# Patient Record
Sex: Male | Born: 1945 | Race: Black or African American | Hispanic: No | Marital: Single | Smoking: Never smoker
Health system: Southern US, Community
[De-identification: ages and names within clinical notes are randomized; demographics above are authoritative.]

## PROBLEM LIST (undated history)

## (undated) DIAGNOSIS — N419 Inflammatory disease of prostate, unspecified: Secondary | ICD-10-CM

## (undated) DIAGNOSIS — R3912 Poor urinary stream: Secondary | ICD-10-CM

## (undated) DIAGNOSIS — R972 Elevated prostate specific antigen [PSA]: Secondary | ICD-10-CM

## (undated) DIAGNOSIS — I10 Essential (primary) hypertension: Secondary | ICD-10-CM

## (undated) DIAGNOSIS — N400 Enlarged prostate without lower urinary tract symptoms: Secondary | ICD-10-CM

## (undated) DIAGNOSIS — K4090 Unilateral inguinal hernia, without obstruction or gangrene, not specified as recurrent: Secondary | ICD-10-CM

## (undated) DIAGNOSIS — N433 Hydrocele, unspecified: Secondary | ICD-10-CM

## (undated) HISTORY — DX: Hydrocele, unspecified: N43.3

## (undated) HISTORY — DX: Unilateral inguinal hernia, without obstruction or gangrene, not specified as recurrent: K40.90

## (undated) HISTORY — DX: Benign prostatic hyperplasia without lower urinary tract symptoms: N40.0

## (undated) HISTORY — DX: Essential (primary) hypertension: I10

## (undated) HISTORY — DX: Elevated prostate specific antigen (PSA): R97.20

## (undated) HISTORY — DX: Poor urinary stream: R39.12

## (undated) HISTORY — DX: Inflammatory disease of prostate, unspecified: N41.9

## (undated) HISTORY — PX: HERNIA REPAIR: SHX51

---

## 2005-05-02 ENCOUNTER — Other Ambulatory Visit: Payer: Self-pay

## 2005-05-07 ENCOUNTER — Ambulatory Visit: Payer: Self-pay | Admitting: Surgery

## 2011-02-20 HISTORY — PX: OTHER SURGICAL HISTORY: SHX169

## 2011-08-07 DIAGNOSIS — N433 Hydrocele, unspecified: Secondary | ICD-10-CM | POA: Diagnosis not present

## 2011-08-07 DIAGNOSIS — N432 Other hydrocele: Secondary | ICD-10-CM | POA: Diagnosis not present

## 2011-08-07 DIAGNOSIS — R972 Elevated prostate specific antigen [PSA]: Secondary | ICD-10-CM | POA: Diagnosis not present

## 2011-09-11 DIAGNOSIS — R972 Elevated prostate specific antigen [PSA]: Secondary | ICD-10-CM | POA: Diagnosis not present

## 2011-09-11 DIAGNOSIS — N433 Hydrocele, unspecified: Secondary | ICD-10-CM | POA: Diagnosis not present

## 2011-09-11 DIAGNOSIS — N432 Other hydrocele: Secondary | ICD-10-CM | POA: Diagnosis not present

## 2011-11-27 DIAGNOSIS — N400 Enlarged prostate without lower urinary tract symptoms: Secondary | ICD-10-CM | POA: Diagnosis not present

## 2011-11-27 DIAGNOSIS — N433 Hydrocele, unspecified: Secondary | ICD-10-CM | POA: Diagnosis not present

## 2011-11-27 DIAGNOSIS — N411 Chronic prostatitis: Secondary | ICD-10-CM | POA: Diagnosis not present

## 2011-11-27 DIAGNOSIS — R972 Elevated prostate specific antigen [PSA]: Secondary | ICD-10-CM | POA: Diagnosis not present

## 2011-11-28 ENCOUNTER — Ambulatory Visit: Payer: Self-pay | Admitting: Urology

## 2011-11-28 DIAGNOSIS — Z01810 Encounter for preprocedural cardiovascular examination: Secondary | ICD-10-CM | POA: Diagnosis not present

## 2011-11-28 DIAGNOSIS — N432 Other hydrocele: Secondary | ICD-10-CM | POA: Diagnosis not present

## 2011-11-28 DIAGNOSIS — I4510 Unspecified right bundle-branch block: Secondary | ICD-10-CM | POA: Diagnosis not present

## 2011-11-28 DIAGNOSIS — Z01812 Encounter for preprocedural laboratory examination: Secondary | ICD-10-CM | POA: Diagnosis not present

## 2011-12-03 ENCOUNTER — Ambulatory Visit: Payer: Self-pay | Admitting: Urology

## 2011-12-03 DIAGNOSIS — I4510 Unspecified right bundle-branch block: Secondary | ICD-10-CM | POA: Diagnosis not present

## 2011-12-03 DIAGNOSIS — R972 Elevated prostate specific antigen [PSA]: Secondary | ICD-10-CM | POA: Diagnosis not present

## 2011-12-03 DIAGNOSIS — N432 Other hydrocele: Secondary | ICD-10-CM | POA: Diagnosis not present

## 2011-12-03 DIAGNOSIS — N400 Enlarged prostate without lower urinary tract symptoms: Secondary | ICD-10-CM | POA: Diagnosis not present

## 2011-12-03 DIAGNOSIS — N433 Hydrocele, unspecified: Secondary | ICD-10-CM | POA: Diagnosis not present

## 2011-12-03 DIAGNOSIS — N411 Chronic prostatitis: Secondary | ICD-10-CM | POA: Diagnosis not present

## 2011-12-03 LAB — BASIC METABOLIC PANEL
Anion Gap: 6 — ABNORMAL LOW (ref 7–16)
BUN: 20 mg/dL — ABNORMAL HIGH (ref 7–18)
Chloride: 110 mmol/L — ABNORMAL HIGH (ref 98–107)
Creatinine: 1.23 mg/dL (ref 0.60–1.30)
EGFR (African American): >60
Potassium: 3.8 mmol/L (ref 3.5–5.1)

## 2011-12-06 LAB — PATHOLOGY REPORT

## 2012-02-06 DIAGNOSIS — N433 Hydrocele, unspecified: Secondary | ICD-10-CM | POA: Diagnosis not present

## 2012-02-06 DIAGNOSIS — Z125 Encounter for screening for malignant neoplasm of prostate: Secondary | ICD-10-CM | POA: Diagnosis not present

## 2012-02-06 DIAGNOSIS — N400 Enlarged prostate without lower urinary tract symptoms: Secondary | ICD-10-CM | POA: Diagnosis not present

## 2012-02-06 DIAGNOSIS — R972 Elevated prostate specific antigen [PSA]: Secondary | ICD-10-CM | POA: Diagnosis not present

## 2012-08-06 DIAGNOSIS — N433 Hydrocele, unspecified: Secondary | ICD-10-CM | POA: Diagnosis not present

## 2012-08-06 DIAGNOSIS — R972 Elevated prostate specific antigen [PSA]: Secondary | ICD-10-CM | POA: Diagnosis not present

## 2012-08-06 DIAGNOSIS — N400 Enlarged prostate without lower urinary tract symptoms: Secondary | ICD-10-CM | POA: Diagnosis not present

## 2013-02-05 DIAGNOSIS — N400 Enlarged prostate without lower urinary tract symptoms: Secondary | ICD-10-CM | POA: Diagnosis not present

## 2013-02-05 DIAGNOSIS — R972 Elevated prostate specific antigen [PSA]: Secondary | ICD-10-CM | POA: Diagnosis not present

## 2013-05-28 DIAGNOSIS — R609 Edema, unspecified: Secondary | ICD-10-CM | POA: Diagnosis not present

## 2013-05-28 DIAGNOSIS — N400 Enlarged prostate without lower urinary tract symptoms: Secondary | ICD-10-CM | POA: Diagnosis not present

## 2013-08-06 DIAGNOSIS — N400 Enlarged prostate without lower urinary tract symptoms: Secondary | ICD-10-CM | POA: Diagnosis not present

## 2014-02-05 DIAGNOSIS — N433 Hydrocele, unspecified: Secondary | ICD-10-CM | POA: Diagnosis not present

## 2014-02-05 DIAGNOSIS — R3912 Poor urinary stream: Secondary | ICD-10-CM | POA: Diagnosis not present

## 2014-02-05 DIAGNOSIS — R972 Elevated prostate specific antigen [PSA]: Secondary | ICD-10-CM | POA: Diagnosis not present

## 2014-02-05 DIAGNOSIS — R030 Elevated blood-pressure reading, without diagnosis of hypertension: Secondary | ICD-10-CM | POA: Diagnosis not present

## 2014-02-05 DIAGNOSIS — N400 Enlarged prostate without lower urinary tract symptoms: Secondary | ICD-10-CM | POA: Diagnosis not present

## 2014-06-08 NOTE — Op Note (Signed)
PATIENT NAME:  Brendan Carter, Brendan Carter MR#:  [id number] DATE OF BIRTH:  Mar 28, 1945  DATE OF PROCEDURE:  12/03/2011  PREOPERATIVE DIAGNOSIS: Right hydrocele.  POSTOPERATIVE DIAGNOSIS:  Right hydrocele.  PROCEDURE:  Hydrocelectomy.  SURGEON: Alcus Dad, MD   ANESTHESIA:  General.  INDICATIONS: This patient has had a hydrocele on the right side which occurred following a herniorrhaphy. Antibiotics were not successful at alleviating the persistence of the hydrocele. Aspirations were only temporary and the patient is scheduled at this time for hydrocelectomy.   DESCRIPTION OF PROCEDURE: In the supine position under general anesthesia, the lower abdomen and genital area were prepped and draped for surgery. An incision was made in the scrotal raphe and carried for the extent of the tangent of the hydrocele sac. This amounted to approximately a 9-cm incision. Sharp dissection was used through the layers to the tunica vaginalis. Surrounding attachments were dissected free with alternate blunt and sharp dissection. Bleeding points were cauterized as they appeared. The fluid was drained from the hydrocele sac amounting to 450 mL. The tunica vaginalis was removed, leaving a centimeter margin along the perimeter of the testis and vascular structures. A portion of the remnant of the hydrocele sac was imbricated to obviate a pocket for fluid collection, and after a more thorough search for bleeding points the testis was placed into the scrotal compartment and fixing sutures placed between the scrotal septum and the testicular structures. Marcaine was infiltrated into the walls of the scrotum and the scrotal layers closed with continuous 3-0 chromic catgut. Interrupted 3-0 chromic catgut was used to close the scrotal skin. A bulky dressing was applied. The patient tolerated the procedure well and returned to the recovery area in satisfactory condition.   ____________________________ Alcus Dad, MD jsh:bjt D:   12/03/2011 16:01:05 ET          T: 12/03/2011 16:54:49 ET          JOB#:  [id number]  Alcus Dad MD ELECTRONICALLY SIGNED 12/06/2011 10:44

## 2014-08-05 ENCOUNTER — Encounter: Payer: Self-pay | Admitting: Urology

## 2014-08-05 ENCOUNTER — Ambulatory Visit (INDEPENDENT_AMBULATORY_CARE_PROVIDER_SITE_OTHER): Payer: Medicare Other | Admitting: Urology

## 2014-08-05 VITALS — BP 112/71 | HR 71 | Ht 68.0 in | Wt 142.9 lb

## 2014-08-05 DIAGNOSIS — N432 Other hydrocele: Secondary | ICD-10-CM

## 2014-08-05 DIAGNOSIS — R972 Elevated prostate specific antigen [PSA]: Secondary | ICD-10-CM

## 2014-08-05 DIAGNOSIS — N401 Enlarged prostate with lower urinary tract symptoms: Secondary | ICD-10-CM | POA: Diagnosis not present

## 2014-08-05 DIAGNOSIS — N138 Other obstructive and reflux uropathy: Secondary | ICD-10-CM

## 2014-08-05 MED ORDER — FINASTERIDE 5 MG PO TABS: 5.0000 mg | ORAL_TABLET | Freq: Every day | ORAL | Status: DC

## 2014-08-05 NOTE — Progress Notes (Signed)
08/05/2014 12:22 PM   Brendan Carter [birth year] [id number]  Referring provider: No referring provider defined for this encounter.  Chief Complaint  Patient presents with  . Elevated PSA    6 month follow up  . Benign Prostatic Hypertrophy    HPI: Brendan Carter is a 69 y/o AA male with BPH and LUTS, hydroceles and  elevated PSA who presents for his 6 months follow up.  His IPSS score today is 3/1 (mild).  He is on finasteride 5 mg daily for his prostate.  He complains of frequency, urgency and weak stream.   He has not had any UTI's, fevers, chills, nausea, vomiting, suprapubic pain or blood in the urine.       IPSS      08/05/14 1000       International Prostate Symptom Score   How often have you had the sensation of not emptying your bladder? Not at All     How often have you had to urinate less than every two hours? Less than 1 in 5 times     How often have you found you stopped and started again several times when you urinated? Not at All     How often have you found it difficult to postpone urination? Less than 1 in 5 times     How often have you had a weak urinary stream? Less than 1 in 5 times     How often have you had to strain to start urination? Not at All     How many times did you typically get up at night to urinate? None     Total IPSS Score 3     Quality of Life due to urinary symptoms   If you were to spend the rest of your life with your urinary condition just the way it is now how would you feel about that? Pleased       His PSA History is :  11.3 ng/mL on 11/27/2011.   His has massive BPH, but no family history of PCa.                                     07.8 ng/mL on 08/06/2012                                   10.5 ng/mL on 02/05/2013                                   08.2 ng/mL on 08/06/2013                                   08.7 ng/mL on 02/05/2014                   He has bilateral hydroceles, but he is not having difficulty with them.  He is managing  them with conservative measures.  He is not interested in drainage or hydrocelectomy at this time.  He did undergo a right hydrocelectomy in 2013 with Dr. Madelin Headings.     PMH: Past Medical History  Diagnosis Date  . Elevated PSA   . Hydrocele   . Prostatitis   . Inguinal hernia  left  . BPH (benign prostatic hyperplasia)   . Weak urinary stream   . Hypertension     Surgical History: Past Surgical History  Procedure Laterality Date  . Hydrocelectomy Right 2013  . Hernia repair Right     Home Medications:    Medication List       This list is accurate as of: 08/05/14 12:22 PM.  Always use your most recent med list.               finasteride 5 MG tablet  Commonly known as:  PROSCAR  Take 5 mg by mouth daily.        Allergies: No Known Allergies  Family History: Family History  Problem Relation Age of Onset  . Kidney cancer Neg Hx   . Kidney disease Neg Hx   . Prostate cancer Neg Hx     Social History:  reports that he has never smoked. He does not have any smokeless tobacco history on file. He reports that he does not drink alcohol or use illicit drugs.  ROS: Urological Symptom Review  Patient is experiencing the following symptoms: No GU complaints at this time.   Review of Systems  Gastrointestinal (upper)  : Negative for upper GI symptoms  Gastrointestinal (lower) : Negative for lower GI symptoms  Constitutional : Negative for symptoms  Skin: Negative for skin symptoms  Eyes: Negative for eye symptoms  Ear/Nose/Throat : Negative for Ear/Nose/Throat symptoms  Hematologic/Lymphatic: Negative for Hematologic/Lymphatic symptoms  Cardiovascular : Negative for cardiovascular symptoms  Respiratory : Negative for respiratory symptoms  Endocrine: Negative for endocrine symptoms  Musculoskeletal: Negative for musculoskeletal symptoms  Neurological: Negative for neurological symptoms  Psychologic: Negative for psychiatric  symptoms   Physical Exam: BP 112/71 mmHg  Pulse 71  Ht 5\' 8"  (1.727 m)  Wt 142 lb 14.4 oz (64.819 kg)  BMI 21.73 kg/m2  GU: Patient with uncircumcised phallus. Foreskin easily retracted.  Urethral meatus is patent.  No penile discharge. No penile lesions or rashes. Scrotum without lesions, cysts, rashes and/or edema.  Patient with bilateral hydroceles, left larger than the right.  Testicles are located scrotally bilaterally. No masses are appreciated in the testicles. Left and right epididymis are normal. Rectal: Patient with  normal sphincter tone. Perineum without scarring or rashes. No rectal masses are appreciated. Prostate is approximately 100 grams, no nodules are appreciated. Seminal vesicles are normal.   Laboratory Data: Lab Results  Component Value Date   HGB 14.2 11/28/2011    Lab Results  Component Value Date   CREATININE 1.23 12/03/2011    No results found for: PSA  No results found for: TESTOSTERONE  No results found for: HGBA1C  Urinalysis No results found for: COLORURINE, APPEARANCEUR, LABSPEC, PHURINE, GLUCOSEU, HGBUR, BILIRUBINUR, KETONESUR, PROTEINUR, UROBILINOGEN, NITRITE, LEUKOCYTESUR  Pertinent Imaging:   Assessment & Plan:    1. Elevated PSA-  Patient has had a h/o elevated PSA for the last 10 yrs.  His PSA's have been in the 8 to 10 range.  There is no record of a bx being performed and patient does not recall having a bx in the past.  He has massive BPH.  We are following him closely with DRE's, PSA's and IPSS score's.    PSA History:             11.3 ng/mL on 11/27/2011.  07.8 ng/mL on 08/06/2012                                   10.5 ng/mL on 02/05/2013                                   08.2 ng/mL on 08/06/2013                                   08.7 ng/mL on 02/05/2014                    - PSA drawn today  2. BPH with LUTS-  Patient has massive BPH, but his IPSS score 3/1 (mild).  We will continue to  monitor him every 6 months with DRE's, PSA's and IPSS score's.    3. 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 He is not finding them bothersome at this time, so he is not interested in drainage or hydrocelectomy at this time.  We will continue to monitor.     No Follow-up on file.  Zara Council, [facility] [address], [place], [place] [address] [phone]

## 2014-08-06 ENCOUNTER — Telehealth: Payer: Self-pay

## 2014-08-06 LAB — PSA: PROSTATE SPECIFIC AG, SERUM: 8.8 ng/mL — AB (ref 0.0–4.0)

## 2014-08-06 NOTE — Telephone Encounter (Signed)
Line Busy.

## 2014-08-06 NOTE — Telephone Encounter (Signed)
-----   Message from Nori Riis, PA-C sent at 08/06/2014  8:20 AM EDT ----- PSA is stable.  We will see him in 6 months.

## 2014-08-09 NOTE — Telephone Encounter (Signed)
Line busy

## 2014-08-10 NOTE — Telephone Encounter (Signed)
Will send letter with information from [facility]. Cw,lpn

## 2015-01-12 ENCOUNTER — Emergency Department: Payer: Medicare Other

## 2015-01-12 ENCOUNTER — Inpatient Hospital Stay
Admission: EM | Admit: 2015-01-12 | Discharge: 2015-01-15 | DRG: 872 | Disposition: A | Discharge status: Home / Self Care | Payer: Medicare Other | Attending: Internal Medicine | Admitting: Internal Medicine

## 2015-01-12 ENCOUNTER — Encounter: Payer: Self-pay | Admitting: Emergency Medicine

## 2015-01-12 DIAGNOSIS — N400 Enlarged prostate without lower urinary tract symptoms: Secondary | ICD-10-CM | POA: Diagnosis present

## 2015-01-12 DIAGNOSIS — R509 Fever, unspecified: Secondary | ICD-10-CM

## 2015-01-12 DIAGNOSIS — J9811 Atelectasis: Secondary | ICD-10-CM | POA: Diagnosis present

## 2015-01-12 DIAGNOSIS — N451 Epididymitis: Secondary | ICD-10-CM | POA: Diagnosis present

## 2015-01-12 DIAGNOSIS — N12 Tubulo-interstitial nephritis, not specified as acute or chronic: Secondary | ICD-10-CM | POA: Diagnosis present

## 2015-01-12 DIAGNOSIS — K4090 Unilateral inguinal hernia, without obstruction or gangrene, not specified as recurrent: Secondary | ICD-10-CM | POA: Diagnosis present

## 2015-01-12 DIAGNOSIS — A419 Sepsis, unspecified organism: Secondary | ICD-10-CM | POA: Diagnosis not present

## 2015-01-12 DIAGNOSIS — N401 Enlarged prostate with lower urinary tract symptoms: Secondary | ICD-10-CM | POA: Diagnosis present

## 2015-01-12 DIAGNOSIS — N3000 Acute cystitis without hematuria: Secondary | ICD-10-CM | POA: Diagnosis present

## 2015-01-12 DIAGNOSIS — A4151 Sepsis due to Escherichia coli [E. coli]: Secondary | ICD-10-CM | POA: Diagnosis present

## 2015-01-12 DIAGNOSIS — I861 Scrotal varices: Secondary | ICD-10-CM | POA: Diagnosis present

## 2015-01-12 DIAGNOSIS — N50819 Testicular pain, unspecified: Secondary | ICD-10-CM | POA: Diagnosis present

## 2015-01-12 DIAGNOSIS — N5082 Scrotal pain: Secondary | ICD-10-CM

## 2015-01-12 DIAGNOSIS — N390 Urinary tract infection, site not specified: Secondary | ICD-10-CM | POA: Diagnosis not present

## 2015-01-12 DIAGNOSIS — I10 Essential (primary) hypertension: Secondary | ICD-10-CM | POA: Diagnosis present

## 2015-01-12 DIAGNOSIS — R972 Elevated prostate specific antigen [PSA]: Secondary | ICD-10-CM

## 2015-01-12 DIAGNOSIS — E876 Hypokalemia: Secondary | ICD-10-CM | POA: Diagnosis present

## 2015-01-12 DIAGNOSIS — Z8042 Family history of malignant neoplasm of prostate: Secondary | ICD-10-CM

## 2015-01-12 DIAGNOSIS — R000 Tachycardia, unspecified: Secondary | ICD-10-CM | POA: Diagnosis present

## 2015-01-12 DIAGNOSIS — N503 Cyst of epididymis: Secondary | ICD-10-CM | POA: Diagnosis not present

## 2015-01-12 DIAGNOSIS — Z8051 Family history of malignant neoplasm of kidney: Secondary | ICD-10-CM

## 2015-01-12 LAB — CBC WITH DIFFERENTIAL/PLATELET
Basophils Absolute: 0 10*3/uL (ref 0–0.1)
Basophils Relative: 0 %
EOS PCT: 0 %
Eosinophils Absolute: 0 10*3/uL (ref 0–0.7)
HEMATOCRIT: 39.4 % — AB (ref 40.0–52.0)
Hemoglobin: 13.1 g/dL (ref 13.0–18.0)
LYMPHS ABS: 0.4 10*3/uL — AB (ref 1.0–3.6)
LYMPHS PCT: 2 %
MCH: 33.2 pg (ref 26.0–34.0)
MCHC: 33.3 g/dL (ref 32.0–36.0)
MCV: 99.8 fL (ref 80.0–100.0)
MONO ABS: 0.6 10*3/uL (ref 0.2–1.0)
Monocytes Relative: 3 %
Neutro Abs: 18.2 10*3/uL — ABNORMAL HIGH (ref 1.4–6.5)
Neutrophils Relative %: 95 %
PLATELETS: 155 10*3/uL (ref 150–440)
RBC: 3.95 MIL/uL — AB (ref 4.40–5.90)
RDW: 13 % (ref 11.5–14.5)
WBC: 19.3 10*3/uL — AB (ref 3.8–10.6)

## 2015-01-12 LAB — COMPREHENSIVE METABOLIC PANEL
ALBUMIN: 3.7 g/dL (ref 3.5–5.0)
ALT: 21 U/L (ref 17–63)
AST: 25 U/L (ref 15–41)
Alkaline Phosphatase: 78 U/L (ref 38–126)
Anion gap: 7 (ref 5–15)
BUN: 20 mg/dL (ref 6–20)
CHLORIDE: 102 mmol/L (ref 101–111)
CO2: 27 mmol/L (ref 22–32)
Calcium: 8.7 mg/dL — ABNORMAL LOW (ref 8.9–10.3)
Creatinine, Ser: 1.15 mg/dL (ref 0.61–1.24)
GFR calc Af Amer: >60 mL/min (ref >60)
GFR calc non Af Amer: >60 mL/min (ref >60)
GLUCOSE: 175 mg/dL — AB (ref 65–99)
POTASSIUM: 3.7 mmol/L (ref 3.5–5.1)
Sodium: 136 mmol/L (ref 135–145)
Total Bilirubin: 0.6 mg/dL (ref 0.3–1.2)
Total Protein: 7.4 g/dL (ref 6.5–8.1)

## 2015-01-12 LAB — URINALYSIS COMPLETE WITH MICROSCOPIC (ARMC ONLY)
Bilirubin Urine: NEGATIVE
Nitrite: POSITIVE — AB
PH: 6 (ref 5.0–8.0)
PROTEIN: 100 mg/dL — AB
Specific Gravity, Urine: 1.023 (ref 1.005–1.030)

## 2015-01-12 LAB — LACTIC ACID, PLASMA: Lactic Acid, Venous: 1.6 mmol/L (ref 0.5–2.0)

## 2015-01-12 MED ORDER — SODIUM CHLORIDE 0.9 % IV BOLUS (SEPSIS)
1000.0000 mL | Freq: Once | INTRAVENOUS | Status: AC
  Administered 2015-01-12: 1000 mL via INTRAVENOUS

## 2015-01-12 MED ORDER — DEXTROSE 5 % IV SOLN
INTRAVENOUS | Status: AC
  Filled 2015-01-12: qty 10

## 2015-01-12 MED ORDER — DOXYCYCLINE HYCLATE 100 MG PO TABS
100.0000 mg | ORAL_TABLET | Freq: Two times a day (BID) | ORAL | Status: DC
  Administered 2015-01-12 – 2015-01-14 (×4): 100 mg via ORAL
  Filled 2015-01-12 (×4): qty 1

## 2015-01-12 MED ORDER — DEXTROSE 5 % IV SOLN
1.0000 g | Freq: Once | INTRAVENOUS | Status: AC
  Administered 2015-01-12: 1 g via INTRAVENOUS

## 2015-01-12 MED ORDER — ACETAMINOPHEN 325 MG PO TABS
650.0000 mg | ORAL_TABLET | Freq: Once | ORAL | Status: AC | PRN
  Administered 2015-01-12: 650 mg via ORAL
  Filled 2015-01-12: qty 2

## 2015-01-12 NOTE — ED Notes (Signed)
Patient presents to the ED with left testicular pain and fever.  Patient states he had an inguinal hernia repair done 2 years ago and has had swelling in his left testicle when he is up during the day since that time but today is the first day the left testicle has had pain as well as swelling.  Patient also reports recent cold symptoms.

## 2015-01-12 NOTE — ED Notes (Addendum)
Pt placed on oxygen at 1lpm via [place]. Pt and spouse updated on care plan. Pt and spouse verbalize understanding. Call bell at right side.

## 2015-01-12 NOTE — ED Notes (Signed)
Ultrasound in progress in room.  

## 2015-01-12 NOTE — ED Notes (Signed)
Pt into room 11 from xray. Pt assisted up to bed, skin hot and dry. md notified of possible sepsis pt and in to see pt.

## 2015-01-12 NOTE — ED Notes (Signed)
md notified urine has resulted.

## 2015-01-12 NOTE — H&P (Signed)
[place] [facility] [place] NAME: Brendan Carter    MR#:  [id number]  DATE OF BIRTH:  30-Oct-1945  DATE OF ADMISSION:  01/12/2015  PRIMARY CARE PHYSICIAN: No PCP Per Patient   REQUESTING/REFERRING PHYSICIAN: Marcelene Butte, M.D.  CHIEF COMPLAINT:   Chief Complaint  Patient presents with  . Groin Swelling  . Groin Pain    HISTORY OF PRESENT ILLNESS:  Brendan Carter  is a 69 y.o. male who presents with left-sided testicular pain and fever. Patient is poor historian, but has a history of BPH on finasteride. He denies any other infectious symptoms. Patient states that the testicular pain has been present only since earlier this morning. He denies any aggravating or alleviating factors. He did not try and take anything for this condition. In the ED he was found to have a nitrite positive UA, and on ultrasound of the scrotum was found to have epididymitis. He was also found to have a leukocytosis with white blood cell count at 19. Hospitalists were called for admission for sepsis due to UTI and epididymitis.  PAST MEDICAL HISTORY:   Past Medical History  Diagnosis Date  . Elevated PSA   . Hydrocele   . Prostatitis   . Inguinal hernia     left  . BPH (benign prostatic hyperplasia)   . Weak urinary stream   . Hypertension     PAST SURGICAL HISTORY:   Past Surgical History  Procedure Laterality Date  . Hydrocelectomy Right 2013  . Hernia repair Right     SOCIAL HISTORY:   Social History  Substance Use Topics  . Smoking status: Never Smoker   . Smokeless tobacco: Not on file  . Alcohol Use: No    FAMILY HISTORY:   Family History  Problem Relation Age of Onset  . Kidney cancer Neg Hx   . Kidney disease Neg Hx   . Prostate cancer Neg Hx     DRUG ALLERGIES:  No Known Allergies  MEDICATIONS AT HOME:   Prior to Admission medications   Medication Sig Start Date End Date Taking? Authorizing Provider  Chlorphen-Phenyleph-ASA  (ALKA-SELTZER PLUS COLD PO) Take 2 tablets by mouth as needed (for cough/congestion).   Yes Historical Provider, MD  finasteride (PROSCAR) 5 MG tablet Take 1 tablet (5 mg total) by mouth daily. 08/05/14  Yes Nori Riis, PA-C    REVIEW OF SYSTEMS:  Review of Systems  Constitutional: Positive for fever. Negative for chills, weight loss and malaise/fatigue.  HENT: Negative for ear pain, hearing loss and tinnitus.   Eyes: Negative for blurred vision, double vision, pain and redness.  Respiratory: Negative for cough, hemoptysis and shortness of breath.   Cardiovascular: Negative for chest pain, palpitations, orthopnea and leg swelling.  Gastrointestinal: Negative for nausea, vomiting, abdominal pain, diarrhea and constipation.  Genitourinary: Negative for dysuria, frequency and hematuria.       Left testicular pain  Musculoskeletal: Negative for back pain, joint pain and neck pain.  Skin:       No acne, rash, or lesions  Neurological: Negative for dizziness, tremors, focal weakness and weakness.  Endo/Heme/Allergies: Negative for polydipsia. Does not bruise/bleed easily.  Psychiatric/Behavioral: Negative for depression. The patient is not nervous/anxious and does not have insomnia.      VITAL SIGNS:   Filed Vitals:   01/12/15 2250 01/12/15 2300 01/12/15 2315 01/12/15 2330  BP:  104/67  105/76  Pulse:  84 85 85  Temp: 99.5 F (37.5 C)  TempSrc: Oral     Resp:  21 19 21   Height:      Weight:      SpO2:  100% 100% 100%   Wt Readings from Last 3 Encounters:  01/12/15 63.56 kg (140 lb 2 oz)  08/05/14 64.819 kg (142 lb 14.4 oz)    PHYSICAL EXAMINATION:  Physical Exam  Vitals reviewed. Constitutional: He is oriented to person, place, and time. He appears well-developed and well-nourished. No distress.  HENT:  Head: Normocephalic and atraumatic.  Mouth/Throat: Oropharynx is clear and moist.  Eyes: Conjunctivae and EOM are normal. Pupils are equal, round, and reactive to  light. No scleral icterus.  Neck: Normal range of motion. Neck supple. No JVD present. No thyromegaly present.  Cardiovascular: Normal rate, regular rhythm and intact distal pulses.  Exam reveals no gallop and no friction rub.   No murmur heard. Respiratory: Effort normal and breath sounds normal. No respiratory distress. He has no wheezes. He has no rales.  GI: Soft. Bowel sounds are normal. He exhibits no distension. There is no tenderness.  Genitourinary:  Tender scrotum, left side.  Musculoskeletal: Normal range of motion. He exhibits no edema.  No arthritis, no gout  Lymphadenopathy:    He has no cervical adenopathy.  Neurological: He is alert and oriented to person, place, and time. No cranial nerve deficit.  No dysarthria, no aphasia  Skin: Skin is warm and dry. No rash noted. No erythema.  Psychiatric: He has a normal mood and affect. His behavior is normal. Judgment and thought content normal.    LABORATORY PANEL:   CBC  Recent Labs Lab 01/12/15 2103  WBC 19.3*  HGB 13.1  HCT 39.4*  PLT 155   ------------------------------------------------------------------------------------------------------------------  Chemistries   Recent Labs Lab 01/12/15 2103  NA 136  K 3.7  CL 102  CO2 27  GLUCOSE 175*  BUN 20  CREATININE 1.15  CALCIUM 8.7*  AST 25  ALT 21  ALKPHOS 78  BILITOT 0.6   ------------------------------------------------------------------------------------------------------------------  Cardiac Enzymes No results for input(s): TROPONINI in the last 168 hours. ------------------------------------------------------------------------------------------------------------------  RADIOLOGY:  Dg Chest 2 View  01/12/2015  CLINICAL DATA:  Initial valuation for acute fever. EXAM: CHEST  2 VIEW COMPARISON:  None. FINDINGS: Cardiac and mediastinal silhouettes are within normal limits. Tracheal air column midline and patent. Lungs are mildly hypoinflated.  Bibasilar linear opacities most consistent with atelectasis. No consolidative airspace disease. No pulmonary edema or pleural effusion. No pneumothorax. No acute osseus abnormality. Mild degenerative changes within the visualized spine. IMPRESSION: 1. Mild bibasilar atelectasis. 2. No other active cardiopulmonary disease. Electronically Signed   By: Jeannine Boga M.D.   On: 01/12/2015 21:24   US Scrotum  01/12/2015  CLINICAL DATA:  Acute onset of left testicular pain and fever. Initial encounter. EXAM: SCROTAL ULTRASOUND DOPPLER ULTRASOUND OF THE TESTICLES TECHNIQUE: Complete ultrasound examination of the testicles, epididymis, and other scrotal structures was performed. Color and spectral Doppler ultrasound were also utilized to evaluate blood flow to the testicles. COMPARISON:  None. FINDINGS: Right testicle Measurements: 4.7 x 2.1 x 3.0 cm. No mass or microlithiasis visualized. Left testicle Measurements: 4.5 x 3.3 x 3.4 cm. No mass or microlithiasis visualized. Right epididymis: A 0.5 cm cyst is noted at the right epididymal head. Left epididymis: Diffusely enlarged, with increased vascularity, compatible with epididymitis. Hydrocele:  A small left-sided hydrocele is noted. Varicocele:  A large patent left-sided varicocele is noted. Pulsed Doppler interrogation of both testes demonstrates normal low resistance arterial and  venous waveforms bilaterally. Bowel is noted extending into the left scrotum. IMPRESSION: 1. Left-sided epididymitis, with diffuse epididymal enlargement and increased vascularity. 2. Bowel noted extending into the left scrotum, reflecting recurrent inguinal hernia. 3. Large patent left-sided varicocele noted. 4. Small left-sided hydrocele seen. 5. 0.5 cm right epididymal head cyst noted. Electronically Signed   By: Garald Balding M.D.   On: 01/12/2015 22:53   [place] Art/ven Flow Abd Pelv Doppler  01/12/2015  CLINICAL DATA:  Acute onset of left testicular pain and fever. Initial  encounter. EXAM: SCROTAL ULTRASOUND DOPPLER ULTRASOUND OF THE TESTICLES TECHNIQUE: Complete ultrasound examination of the testicles, epididymis, and other scrotal structures was performed. Color and spectral Doppler ultrasound were also utilized to evaluate blood flow to the testicles. COMPARISON:  None. FINDINGS: Right testicle Measurements: 4.7 x 2.1 x 3.0 cm. No mass or microlithiasis visualized. Left testicle Measurements: 4.5 x 3.3 x 3.4 cm. No mass or microlithiasis visualized. Right epididymis: A 0.5 cm cyst is noted at the right epididymal head. Left epididymis: Diffusely enlarged, with increased vascularity, compatible with epididymitis. Hydrocele:  A small left-sided hydrocele is noted. Varicocele:  A large patent left-sided varicocele is noted. Pulsed Doppler interrogation of both testes demonstrates normal low resistance arterial and venous waveforms bilaterally. Bowel is noted extending into the left scrotum. IMPRESSION: 1. Left-sided epididymitis, with diffuse epididymal enlargement and increased vascularity. 2. Bowel noted extending into the left scrotum, reflecting recurrent inguinal hernia. 3. Large patent left-sided varicocele noted. 4. Small left-sided hydrocele seen. 5. 0.5 cm right epididymal head cyst noted. Electronically Signed   By: Garald Balding M.D.   On: 01/12/2015 22:53    EKG:   Orders placed or performed during the hospital encounter of 01/12/15  . EKG 12-Lead  . EKG 12-Lead    IMPRESSION AND PLAN:  Principal Problem:   Sepsis ([place]) - fever and leukocytosis with initial tachycardia when he presented to the ED. Source found to be UTI, as well as epididymitis. IV antibiotics given in the ED, blood and urine cultures sent, lactic acid was within normal limits, patient is hemodynamically stable. Admit, fluid support for blood pressure maintenance, antibiotics as above. Active Problems:   Complicated UTI (urinary tract infection) - likely predisposed due to his BPH.  Antibiotics and cultures as above. Follow cultures and tailor antibiotics per the same.   Epididymitis - seen on ultrasound, treat with doxycycline.   BPH with elevated PSA - likely predisposing to UTI. PSAs monitored twice a year per patient's report.  All the records are reviewed and case discussed with ED provider. Management plans discussed with the patient and/or family.  DVT PROPHYLAXIS: SubQ lovenox  ADMISSION STATUS: Inpatient  CODE STATUS: Full  TOTAL TIME TAKING CARE OF THIS PATIENT: 40 minutes.    Wilkins Elpers FIELDING 01/12/2015, 11:36 PM  [facility]  Office  [phone]  CC: Primary care physician; No PCP Per Patient

## 2015-01-12 NOTE — ED Notes (Signed)
Pt and spouse updated on admission process. Pt verbalizes understanding. Pt states is feeling "a little better". Skin normal color, warm at this time and dry.

## 2015-01-12 NOTE — ED Notes (Signed)
145mL of dark yellow slightly cloudy urine produced by pt. Sample sent to lab.

## 2015-01-12 NOTE — ED Notes (Signed)
Pt to xray via w/c accomp by xray tech; care nurse notified of pt and to notify secretary to call Code Sepsis

## 2015-01-12 NOTE — ED Notes (Signed)
Spoke with dr. Junita Push regarding antibiotics, no new orders received at this time.

## 2015-01-12 NOTE — ED Provider Notes (Signed)
Time Seen: Approximately 2100  I have reviewed the triage notes  Chief Complaint: Groin Swelling and Groin Pain   History of Present Illness: Brendan Carter is a 69 y.o. male who presents with acute onset of left-sided testicular pain and a fever. She had extremely poor historian records shows he is a history of prostatic hypertrophy and previous history of prostatitis. Patient states he's noticed some increase in the chronic swelling that he had especially in his left testicle and scrotal area. He was not aware that he had a fever. He has had some loose stool but no true diarrhea. Patient describes history of inguinal hernia. Approximately 2 years ago. He states the swelling has been present in his left testicle but the pain started earlier today. He denies any dysuria, hematuria, urinary frequency.  Patient was called as a code sepsis Past Medical History  Diagnosis Date  . Elevated PSA   . Hydrocele   . Prostatitis   . Inguinal hernia     left  . BPH (benign prostatic hyperplasia)   . Weak urinary stream   . Hypertension     There are no active problems to display for this patient.   Past Surgical History  Procedure Laterality Date  . Hydrocelectomy Right 2013  . Hernia repair Right     Past Surgical History  Procedure Laterality Date  . Hydrocelectomy Right 2013  . Hernia repair Right     Current Outpatient Rx  Name  Route  Sig  Dispense  Refill  . Chlorphen-Phenyleph-ASA (ALKA-SELTZER PLUS COLD PO)   Oral   Take 2 tablets by mouth as needed (for cough/congestion).         . finasteride (PROSCAR) 5 MG tablet   Oral   Take 1 tablet (5 mg total) by mouth daily.   90 tablet   3     Allergies:  Review of patient's allergies indicates no known allergies.  Family History: Family History  Problem Relation Age of Onset  . Kidney cancer Neg Hx   . Kidney disease Neg Hx   . Prostate cancer Neg Hx     Social History: Social History  Substance Use Topics   . Smoking status: Never Smoker   . Smokeless tobacco: None  . Alcohol Use: No     Review of Systems:   10 point review of systems was performed and was otherwise negative:  Constitutional: Patient was not aware of a fever at home he does describe some diffuse body aches Eyes: No visual disturbances ENT: No sore throat, ear pain Cardiac: No chest pain Respiratory: No shortness of breath, wheezing, or stridor Abdomen: No abdominal pain, no vomiting, No diarrhea Endocrine: No weight loss, No night sweats Extremities: No peripheral edema, cyanosis Skin: No rashes, easy bruising Neurologic: No focal weakness, trouble with speech or swollowing Urologic: No dysuria, Hematuria, or urinary frequency The testicular swelling and discomfort noted above  Physical Exam:  ED Triage Vitals  Enc Vitals Group     BP 01/12/15 2056 126/74 mmHg     Pulse Rate 01/12/15 2056 105     Resp 01/12/15 2056 20     Temp 01/12/15 2056 102.5 F (39.2 C)     Temp Source 01/12/15 2056 Oral     SpO2 01/12/15 2056 97 %     Weight 01/12/15 2056 145 lb (65.772 kg)     Height 01/12/15 2056 5\' 8"  (1.727 m)     Head Cir --  Peak Flow --      Pain Score 01/12/15 2057 3     Pain Loc --      Pain Edu? --      Excl. in [place]? --     General: Awake , Alert , and Oriented times 3; GCS 15 Head: Normal cephalic , atraumatic Eyes: Pupils equal , round, reactive to light Nose/Throat: No nasal drainage, patent upper airway without erythema or exudate.  Neck: Supple, Full range of motion, No anterior adenopathy or palpable thyroid masses Lungs: Clear to ascultation without wheezes , rhonchi, or rales Heart: Regular rate, regular rhythm without murmurs , gallops , or rubs Abdomen: Soft, non tender without rebound, guarding , or rigidity; bowel sounds positive and symmetric in all 4 quadrants. No organomegaly .        Extremities: 2 plus symmetric pulses. No edema, clubbing or cyanosis Neurologic: normal  ambulation, Motor symmetric without deficits, sensory intact Skin: warm, dry, no rashes Examination of the genital area shows swelling in the left inguinal area along in the scrotal sac with tenderness at the base of the left testicle with what seems to be some normal positioning of the testicle. No penis discharge or drainage. No skin lesions are noted.  Labs:   All laboratory work was reviewed including any pertinent negatives or positives listed below:  Labs Reviewed  COMPREHENSIVE METABOLIC PANEL - Abnormal; Notable for the following:    Glucose, Bld 175 (*)    Calcium 8.7 (*)    All other components within normal limits  CBC WITH DIFFERENTIAL/PLATELET - Abnormal; Notable for the following:    WBC 19.3 (*)    RBC 3.95 (*)    HCT 39.4 (*)    Neutro Abs 18.2 (*)    Lymphs Abs 0.4 (*)    All other components within normal limits  URINALYSIS COMPLETEWITH MICROSCOPIC ([facility] ONLY) - Abnormal; Notable for the following:    Color, Urine YELLOW (*)    APPearance HAZY (*)    Glucose, UA >500 (*)    Ketones, ur TRACE (*)    Hgb urine dipstick 2+ (*)    Protein, ur 100 (*)    Nitrite POSITIVE (*)    Leukocytes, UA TRACE (*)    Bacteria, UA MANY (*)    Squamous Epithelial / LPF 0-5 (*)    All other components within normal limits  CULTURE, BLOOD (ROUTINE X 2)  CULTURE, BLOOD (ROUTINE X 2)  URINE CULTURE  LACTIC ACID, PLASMA  LACTIC ACID, PLASMA   Laboratory work was significant for an elevated white blood cell count signs of urinary tract infection EKG: ED ECG REPORT I, Daymon Larsen, the attending physician, personally viewed and interpreted this ECG.  Date: 01/12/2015 EKG Time: 2121 Rate: 94 Rhythm: normal sinus rhythm QRS Axis: normal Intervals: normal ST/T Wave abnormalities: Nonspecific ST-T wave changes consistent with early repolarization Conduction Disutrbances: none Narrative Interpretation: unremarkable Left ventricular hypertrophy   Radiology:   EXAM: SCROTAL ULTRASOUND  DOPPLER ULTRASOUND OF THE TESTICLES  TECHNIQUE: Complete ultrasound examination of the testicles, epididymis, and other scrotal structures was performed. Color and spectral Doppler ultrasound were also utilized to evaluate blood flow to the testicles.  COMPARISON: None.  FINDINGS: Right testicle  Measurements: 4.7 x 2.1 x 3.0 cm. No mass or microlithiasis visualized.  Left testicle  Measurements: 4.5 x 3.3 x 3.4 cm. No mass or microlithiasis visualized.  Right epididymis: A 0.5 cm cyst is noted at the right epididymal head.  Left epididymis:  Diffusely enlarged, with increased vascularity, compatible with epididymitis.  Hydrocele: A small left-sided hydrocele is noted.  Varicocele: A large patent left-sided varicocele is noted.  Pulsed Doppler interrogation of both testes demonstrates normal low resistance arterial and venous waveforms bilaterally.  Bowel is noted extending into the left scrotum.  IMPRESSION: 1. Left-sided epididymitis, with diffuse epididymal enlargement and increased vascularity. 2. Bowel noted extending into the left scrotum, reflecting recurrent inguinal hernia. 3. Large patent left-sided varicocele noted. 4. Small left-sided hydrocele seen. 5. 0.5 cm right epididymal head cyst noted.      I personally reviewed the radiologic studies EXAM: CHEST 2 VIEW  COMPARISON: None.  FINDINGS: Cardiac and mediastinal silhouettes are within normal limits. Tracheal air column midline and patent.  Lungs are mildly hypoinflated. Bibasilar linear opacities most consistent with atelectasis. No consolidative airspace disease. No pulmonary edema or pleural effusion. No pneumothorax.  No acute osseus abnormality. Mild degenerative changes within the visualized spine.  IMPRESSION: 1. Mild bibasilar atelectasis. 2. No other active cardiopulmonary disease.   Critical Care: CRITICAL CARE Performed by: Daymon Larsen   Total critical care time: 39 minutes  Critical care time was exclusive of separately billable procedures and treating other patients.  Critical care was necessary to treat or prevent imminent or life-threatening deterioration.  Critical care was time spent personally by me on the following activities: development of treatment plan with patient and/or surrogate as well as nursing, discussions with consultants, evaluation of patient's response to treatment, examination of patient, obtaining history from patient or surrogate, ordering and performing treatments and interventions, ordering and review of laboratory studies, ordering and review of radiographic studies, pulse oximetry and re-evaluation of patient's condition. Initiation of treatment for acute sepsis likely secondary to epididymitis and/or prostatitis with possible pyelonephritis   ED Course: * Patient was initiated on workup for sepsis and currently is lactic acid levels are within normal limits shows white blood cell count is over 19,000. A shunt was started on IV Rocephin with the same sores to be either epididymitis, prostatitis, or pyelonephritis.  Patient's case was reviewed with the hospitalist team, further disposition and management depends upon her evaluation  Assessment Acute febrile illness Possible prostatitis Urinary tract infection with possible pyelonephritis Epididymitis    Plan:  Inpatient management            Daymon Larsen, MD 01/12/15 2316

## 2015-01-13 LAB — BASIC METABOLIC PANEL
Anion gap: 7 (ref 5–15)
BUN: 18 mg/dL (ref 6–20)
CHLORIDE: 105 mmol/L (ref 101–111)
CO2: 25 mmol/L (ref 22–32)
Calcium: 7.9 mg/dL — ABNORMAL LOW (ref 8.9–10.3)
Creatinine, Ser: 1.05 mg/dL (ref 0.61–1.24)
GFR calc Af Amer: >60 mL/min (ref >60)
GFR calc non Af Amer: >60 mL/min (ref >60)
GLUCOSE: 134 mg/dL — AB (ref 65–99)
POTASSIUM: 3.5 mmol/L (ref 3.5–5.1)
Sodium: 137 mmol/L (ref 135–145)

## 2015-01-13 LAB — CBC
HEMATOCRIT: 35.6 % — AB (ref 40.0–52.0)
Hemoglobin: 12 g/dL — ABNORMAL LOW (ref 13.0–18.0)
MCH: 33.9 pg (ref 26.0–34.0)
MCHC: 33.7 g/dL (ref 32.0–36.0)
MCV: 100.6 fL — AB (ref 80.0–100.0)
Platelets: 145 10*3/uL — ABNORMAL LOW (ref 150–440)
RBC: 3.54 MIL/uL — ABNORMAL LOW (ref 4.40–5.90)
RDW: 13 % (ref 11.5–14.5)
WBC: 18.8 10*3/uL — AB (ref 3.8–10.6)

## 2015-01-13 LAB — LACTIC ACID, PLASMA: Lactic Acid, Venous: 1.2 mmol/L (ref 0.5–2.0)

## 2015-01-13 MED ORDER — ENOXAPARIN SODIUM 40 MG/0.4ML ~~LOC~~ SOLN
40.0000 mg | Freq: Every day | SUBCUTANEOUS | Status: DC
  Administered 2015-01-13 – 2015-01-14 (×2): 40 mg via SUBCUTANEOUS
  Filled 2015-01-13 (×2): qty 0.4

## 2015-01-13 MED ORDER — DEXTROSE 5 % IV SOLN
1.0000 g | INTRAVENOUS | Status: DC
  Administered 2015-01-13 – 2015-01-14 (×2): 1 g via INTRAVENOUS
  Filled 2015-01-13 (×3): qty 10

## 2015-01-13 MED ORDER — SODIUM CHLORIDE 0.9 % IV SOLN
INTRAVENOUS | Status: DC
  Administered 2015-01-13 – 2015-01-14 (×2): via INTRAVENOUS

## 2015-01-13 MED ORDER — HYDROCODONE-ACETAMINOPHEN 5-325 MG PO TABS: 1.0000 | ORAL_TABLET | ORAL | Status: DC | PRN

## 2015-01-13 MED ORDER — FINASTERIDE 5 MG PO TABS
5.0000 mg | ORAL_TABLET | Freq: Every day | ORAL | Status: DC
  Administered 2015-01-13 – 2015-01-15 (×3): 5 mg via ORAL
  Filled 2015-01-13 (×3): qty 1

## 2015-01-13 MED ORDER — ACETAMINOPHEN 325 MG PO TABS
650.0000 mg | ORAL_TABLET | Freq: Four times a day (QID) | ORAL | Status: DC | PRN
Start: 2015-01-13 — End: 2015-01-15
  Administered 2015-01-13 – 2015-01-14 (×3): 650 mg via ORAL
  Filled 2015-01-13 (×3): qty 2

## 2015-01-13 MED ORDER — ONDANSETRON HCL 4 MG PO TABS
4.0000 mg | ORAL_TABLET | Freq: Four times a day (QID) | ORAL | Status: DC | PRN
Start: 2015-01-13 — End: 2015-01-15

## 2015-01-13 MED ORDER — ONDANSETRON HCL 4 MG/2ML IJ SOLN: 4.0000 mg | Freq: Four times a day (QID) | INTRAMUSCULAR | Status: DC | PRN

## 2015-01-13 MED ORDER — SODIUM CHLORIDE 0.9 % IV SOLN
INTRAVENOUS | Status: DC
Start: 2015-01-13 — End: 2015-01-13
  Administered 2015-01-13: via INTRAVENOUS

## 2015-01-13 MED ORDER — SODIUM CHLORIDE 0.9 % IJ SOLN
3.0000 mL | Freq: Two times a day (BID) | INTRAMUSCULAR | Status: DC
  Administered 2015-01-13 – 2015-01-14 (×4): 3 mL via INTRAVENOUS

## 2015-01-13 MED ORDER — ACETAMINOPHEN 650 MG RE SUPP: 650.0000 mg | Freq: Four times a day (QID) | RECTAL | Status: DC | PRN

## 2015-01-13 MED ORDER — POTASSIUM CHLORIDE CRYS ER 20 MEQ PO TBCR
20.0000 meq | EXTENDED_RELEASE_TABLET | Freq: Once | ORAL | Status: AC
  Administered 2015-01-13: 20 meq via ORAL
  Filled 2015-01-13: qty 1

## 2015-01-13 NOTE — Plan of Care (Signed)
Problem: Safety: Goal: Ability to remain free from injury will improve Outcome: Progressing Pt remained safe and w/out injury this shift. Pt A&O and ambulates w/steady gait.  Pt confirms he knows how to call for any needs.   Problem: Pain Managment: Goal: General experience of comfort will improve Outcome: Progressing Pain assessed during each rounding and pt had no c/o.  Pain medication available.   Problem: Fluid Volume: Goal: Hemodynamic stability will improve Outcome: Progressing Pt has good PO intake.  IVF rate reduced. VSS.  Still running low grade fever. NSR on tele.   Problem: Physical Regulation: Goal: Diagnostic test results will improve Outcome: Progressing Lactic acid 1.2 WNL.  WBC improved to 18.8. Pt getting Rocephin q24 hrs.

## 2015-01-13 NOTE — Progress Notes (Signed)
Patient ID: Brendan Carter, male   DOB: [birth year], 69 y.o.   MRN: [id number] [facility] PROGRESS NOTE  PCP: No PCP Per Patient  HPI/Subjective: Patient seen earlier. At that time, he had 3 out of 10 pain in his left testicle.  Objective: Filed Vitals:   01/13/15 1314 01/13/15 1316  BP: 122/75   Pulse:  91  Temp: 100.1 F (37.8 C)   Resp: 18     Filed Weights   01/12/15 2056 01/12/15 2126 01/13/15 0017  Weight: 65.772 kg (145 lb) 63.56 kg (140 lb 2 oz) 64.819 kg (142 lb 14.4 oz)    ROS: Review of Systems  Constitutional: Positive for fever. Negative for chills.  Eyes: Negative for blurred vision.  Respiratory: Negative for cough and shortness of breath.   Cardiovascular: Negative for chest pain.  Gastrointestinal: Negative for nausea, vomiting, abdominal pain, diarrhea and constipation.  Genitourinary: Negative for dysuria.  Musculoskeletal: Negative for joint pain.  Neurological: Negative for dizziness and headaches.   Exam: Physical Exam  Constitutional: He is oriented to person, place, and time.  HENT:  Nose: No mucosal edema.  Mouth/Throat: No oropharyngeal exudate or posterior oropharyngeal edema.  Eyes: Conjunctivae, EOM and lids are normal. Pupils are equal, round, and reactive to light.  Neck: No JVD present. Carotid bruit is not present. No edema present. No thyroid mass and no thyromegaly present.  Cardiovascular: S1 normal and S2 normal.  Exam reveals no gallop.   No murmur heard. Pulses:      Dorsalis pedis pulses are 2+ on the right side, and 2+ on the left side.  Respiratory: No respiratory distress. He has no wheezes. He has no rhonchi. He has no rales.  GI: Soft. Bowel sounds are normal. There is no tenderness.  Genitourinary:  Left testicle enlarged and tender throughout. Especially tender at the upper pole.  Musculoskeletal:       Right ankle: He exhibits no swelling.       Left ankle: He exhibits no swelling.  Lymphadenopathy:     He has no cervical adenopathy.  Neurological: He is alert and oriented to person, place, and time. No cranial nerve deficit.  Skin: Skin is warm. No rash noted. Nails show no clubbing.  Psychiatric: He has a normal mood and affect.    Data Reviewed: Basic Metabolic Panel:  Recent Labs Lab 01/12/15 2103 01/13/15 0627  NA 136 137  K 3.7 3.5  CL 102 105  CO2 27 25  GLUCOSE 175* 134*  BUN 20 18  CREATININE 1.15 1.05  CALCIUM 8.7* 7.9*   Liver Function Tests:  Recent Labs Lab 01/12/15 2103  AST 25  ALT 21  ALKPHOS 78  BILITOT 0.6  PROT 7.4  ALBUMIN 3.7   CBC:  Recent Labs Lab 01/12/15 2103 01/13/15 0627  WBC 19.3* 18.8*  NEUTROABS 18.2*  --   HGB 13.1 12.0*  HCT 39.4* 35.6*  MCV 99.8 100.6*  PLT 155 145*     Recent Results (from the past 240 hour(s))  Culture, blood (routine x 2)     Status: None (Preliminary result)   Collection Time: 01/12/15  8:47 PM  Result Value Ref Range Status   Specimen Description BLOOD  Final   Special Requests BOTTLES DRAWN AEROBIC AND ANAEROBIC 5CC  Final   Culture NO GROWTH < 12 HOURS  Final   Report Status PENDING  Incomplete  Culture, blood (routine x 2)     Status: None (Preliminary result)   Collection  Time: 01/12/15  9:03 PM  Result Value Ref Range Status   Specimen Description BLOOD  Final   Special Requests BOTTLES DRAWN AEROBIC AND ANAEROBIC 4CC  Final   Culture NO GROWTH < 24 HOURS  Final   Report Status PENDING  Incomplete  Urine culture     Status: None (Preliminary result)   Collection Time: 01/12/15  9:45 PM  Result Value Ref Range Status   Specimen Description URINE, RANDOM  Final   Special Requests NONE  Final   Culture TOO YOUNG TO READ  Final   Report Status PENDING  Incomplete     Studies: Dg Chest 2 View  01/12/2015  CLINICAL DATA:  Initial valuation for acute fever. EXAM: CHEST  2 VIEW COMPARISON:  None. FINDINGS: Cardiac and mediastinal silhouettes are within normal limits. Tracheal air column  midline and patent. Lungs are mildly hypoinflated. Bibasilar linear opacities most consistent with atelectasis. No consolidative airspace disease. No pulmonary edema or pleural effusion. No pneumothorax. No acute osseus abnormality. Mild degenerative changes within the visualized spine. IMPRESSION: 1. Mild bibasilar atelectasis. 2. No other active cardiopulmonary disease. Electronically Signed   By: Jeannine Boga M.D.   On: 01/12/2015 21:24   US Scrotum  01/12/2015  CLINICAL DATA:  Acute onset of left testicular pain and fever. Initial encounter. EXAM: SCROTAL ULTRASOUND DOPPLER ULTRASOUND OF THE TESTICLES TECHNIQUE: Complete ultrasound examination of the testicles, epididymis, and other scrotal structures was performed. Color and spectral Doppler ultrasound were also utilized to evaluate blood flow to the testicles. COMPARISON:  None. FINDINGS: Right testicle Measurements: 4.7 x 2.1 x 3.0 cm. No mass or microlithiasis visualized. Left testicle Measurements: 4.5 x 3.3 x 3.4 cm. No mass or microlithiasis visualized. Right epididymis: A 0.5 cm cyst is noted at the right epididymal head. Left epididymis: Diffusely enlarged, with increased vascularity, compatible with epididymitis. Hydrocele:  A small left-sided hydrocele is noted. Varicocele:  A large patent left-sided varicocele is noted. Pulsed Doppler interrogation of both testes demonstrates normal low resistance arterial and venous waveforms bilaterally. Bowel is noted extending into the left scrotum. IMPRESSION: 1. Left-sided epididymitis, with diffuse epididymal enlargement and increased vascularity. 2. Bowel noted extending into the left scrotum, reflecting recurrent inguinal hernia. 3. Large patent left-sided varicocele noted. 4. Small left-sided hydrocele seen. 5. 0.5 cm right epididymal head cyst noted. Electronically Signed   By: Garald Balding M.D.   On: 01/12/2015 22:53   [place] Art/ven Flow Abd Pelv Doppler  01/12/2015  CLINICAL DATA:  Acute  onset of left testicular pain and fever. Initial encounter. EXAM: SCROTAL ULTRASOUND DOPPLER ULTRASOUND OF THE TESTICLES TECHNIQUE: Complete ultrasound examination of the testicles, epididymis, and other scrotal structures was performed. Color and spectral Doppler ultrasound were also utilized to evaluate blood flow to the testicles. COMPARISON:  None. FINDINGS: Right testicle Measurements: 4.7 x 2.1 x 3.0 cm. No mass or microlithiasis visualized. Left testicle Measurements: 4.5 x 3.3 x 3.4 cm. No mass or microlithiasis visualized. Right epididymis: A 0.5 cm cyst is noted at the right epididymal head. Left epididymis: Diffusely enlarged, with increased vascularity, compatible with epididymitis. Hydrocele:  A small left-sided hydrocele is noted. Varicocele:  A large patent left-sided varicocele is noted. Pulsed Doppler interrogation of both testes demonstrates normal low resistance arterial and venous waveforms bilaterally. Bowel is noted extending into the left scrotum. IMPRESSION: 1. Left-sided epididymitis, with diffuse epididymal enlargement and increased vascularity. 2. Bowel noted extending into the left scrotum, reflecting recurrent inguinal hernia. 3. Large patent left-sided varicocele noted.  4. Small left-sided hydrocele seen. 5. 0.5 cm right epididymal head cyst noted. Electronically Signed   By: Garald Balding M.D.   On: 01/12/2015 22:53    Scheduled Meds: . cefTRIAXone (ROCEPHIN)  IV  1 g Intravenous Q24H  . doxycycline  100 mg Oral Q12H  . enoxaparin (LOVENOX) injection  40 mg Subcutaneous QHS  . finasteride  5 mg Oral Daily  . sodium chloride  3 mL Intravenous Q12H    Assessment/Plan:  1. Clinical sepsis with epididymitis, leukocytosis, fever and tachycardia. Blood cultures so far negative. Patient was given IV Rocephin and doxycycline. Continue to monitor closely. Usually when patients are older than 35 it's a gram-negative rod causing this and Rocephin would cover. 2. BPH without urinary  obstruction on finasteride 3. 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 Varicocele 5. Hypokalemia replace oral potassium  Code Status:     Code Status Orders        Start     Ordered   01/13/15 0015  Full code   Continuous     01/13/15 0014     Disposition Plan: Home once temperature curve comes down  Antibiotics:  Rocephin  Doxycycline  Time spent: 25 minutes  Loletha Grayer  [facility]

## 2015-01-13 NOTE — Plan of Care (Signed)
Problem: Safety: Goal: Ability to remain free from injury will improve Outcome: Progressing Low fall risk. Steady gait, nursing judgement recommend standby assist for safety w/ IV pole. Pt understands how to use call system for assistance. Safe environment provided.   Problem: Pain Managment: Goal: General experience of comfort will improve Outcome: Progressing C/o scrotum pain 3/10 but refused pain medication intervention. Emotional support given. Will continue to assess.   Problem: Fluid Volume: Goal: Hemodynamic stability will improve Outcome: Progressing IVF infusing. VSS. NSR on telemetry.  Problem: Physical Regulation: Goal: Diagnostic test results will improve Outcome: Progressing Lactic Acid 1.2  Goal: Signs and symptoms of infection will decrease Outcome: Progressing WBC 19.3. IV Abx started in ED. Will continue to monitor for any changes.

## 2015-01-14 LAB — BASIC METABOLIC PANEL
Anion gap: 5 (ref 5–15)
BUN: 17 mg/dL (ref 6–20)
CHLORIDE: 106 mmol/L (ref 101–111)
CO2: 25 mmol/L (ref 22–32)
CREATININE: 1.02 mg/dL (ref 0.61–1.24)
Calcium: 8.1 mg/dL — ABNORMAL LOW (ref 8.9–10.3)
GFR calc Af Amer: >60 mL/min (ref >60)
GFR calc non Af Amer: >60 mL/min (ref >60)
Glucose, Bld: 112 mg/dL — ABNORMAL HIGH (ref 65–99)
Potassium: 3.8 mmol/L (ref 3.5–5.1)
SODIUM: 136 mmol/L (ref 135–145)

## 2015-01-14 MED ORDER — LEVOFLOXACIN IN D5W 500 MG/100ML IV SOLN
500.0000 mg | INTRAVENOUS | Status: DC
  Administered 2015-01-14 – 2015-01-15 (×2): 500 mg via INTRAVENOUS
  Filled 2015-01-14 (×3): qty 100

## 2015-01-14 NOTE — Care Management (Signed)
Admitted to [facility] with the diagnosis of sepsis/epididymitis. Lives with friend, Omar Person [phone]).  Next appointment with Urologist is December 16th. No home health. No skilled facility. No home oxygen. Uses no aids for ambulation. Takes care of both basic and instrumental activities of daily living himself, drives. No falls. Good appetite. Friend will transport. Shelbie Ammons RN MSN CCM Care Management [phone]

## 2015-01-14 NOTE — Progress Notes (Signed)
Patient ID: Brendan Carter, male   DOB: 1945-07-28, 69 y.o.   MRN: [id number]  [facility] PROGRESS NOTE  PCP: No PCP Per Patient  HPI/Subjective: When I saw the patient earlier he was complaining about 5 out of 10 pain in his testicle. He was still had a temperature. Offers no other complaints.  Objective: Filed Vitals:   01/13/15 2237 01/14/15 0508  BP:  123/82  Pulse:  84  Temp: 100.4 F (38 C) 100.9 F (38.3 C)  Resp:  20    Filed Weights   01/12/15 2056 01/12/15 2126 01/13/15 0017  Weight: 65.772 kg (145 lb) 63.56 kg (140 lb 2 oz) 64.819 kg (142 lb 14.4 oz)    ROS: Review of Systems  Constitutional: Positive for fever. Negative for chills.  Eyes: Negative for blurred vision.  Respiratory: Negative for cough and shortness of breath.   Cardiovascular: Negative for chest pain.  Gastrointestinal: Negative for nausea, vomiting, abdominal pain, diarrhea and constipation.  Genitourinary: Negative for dysuria.  Musculoskeletal: Negative for joint pain.  Neurological: Negative for dizziness and headaches.   Exam: Physical Exam  Constitutional: He is oriented to person, place, and time.  HENT:  Nose: No mucosal edema.  Mouth/Throat: No oropharyngeal exudate or posterior oropharyngeal edema.  Eyes: Conjunctivae, EOM and lids are normal. Pupils are equal, round, and reactive to light.  Neck: No JVD present. Carotid bruit is not present. No edema present. No thyroid mass and no thyromegaly present.  Cardiovascular: S1 normal and S2 normal.  Exam reveals no gallop.   No murmur heard. Pulses:      Dorsalis pedis pulses are 2+ on the right side, and 2+ on the left side.  Respiratory: No respiratory distress. He has no wheezes. He has no rhonchi. He has no rales.  GI: Soft. Bowel sounds are normal. There is no tenderness.  Genitourinary:  Left testicle enlarged and tender throughout. Especially tender at the upper pole.  Musculoskeletal:       Right ankle: He  exhibits no swelling.       Left ankle: He exhibits no swelling.  Lymphadenopathy:    He has no cervical adenopathy.  Neurological: He is alert and oriented to person, place, and time. No cranial nerve deficit.  Skin: Skin is warm. No rash noted. Nails show no clubbing.  Psychiatric: He has a normal mood and affect.    Data Reviewed: Basic Metabolic Panel:  Recent Labs Lab 01/12/15 2103 01/13/15 0627 01/14/15 0706  NA 136 137 136  K 3.7 3.5 3.8  CL 102 105 106  CO2 27 25 25   GLUCOSE 175* 134* 112*  BUN 20 18 17   CREATININE 1.15 1.05 1.02  CALCIUM 8.7* 7.9* 8.1*   Liver Function Tests:  Recent Labs Lab 01/12/15 2103  AST 25  ALT 21  ALKPHOS 78  BILITOT 0.6  PROT 7.4  ALBUMIN 3.7   CBC:  Recent Labs Lab 01/12/15 2103 01/13/15 0627  WBC 19.3* 18.8*  NEUTROABS 18.2*  --   HGB 13.1 12.0*  HCT 39.4* 35.6*  MCV 99.8 100.6*  PLT 155 145*     Recent Results (from the past 240 hour(s))  Culture, blood (routine x 2)     Status: None (Preliminary result)   Collection Time: 01/12/15  8:47 PM  Result Value Ref Range Status   Specimen Description BLOOD  Final   Special Requests BOTTLES DRAWN AEROBIC AND ANAEROBIC 5CC  Final   Culture NO GROWTH 2 DAYS  Final  Report Status PENDING  Incomplete  Culture, blood (routine x 2)     Status: None (Preliminary result)   Collection Time: 01/12/15  9:03 PM  Result Value Ref Range Status   Specimen Description BLOOD  Final   Special Requests BOTTLES DRAWN AEROBIC AND ANAEROBIC 4CC  Final   Culture  Setup Time   Final    GRAM NEGATIVE RODS ANAEROBIC BOTTLE ONLY CRITICAL RESULT CALLED TO, READ BACK BY AND VERIFIED WITH: CASEY ZAUGHANSINWARD AT 2159 01/13/15 MLZ  CONFIRMED BY SDR     Culture   Final    GRAM NEGATIVE RODS ANAEROBIC BOTTLE ONLY IDENTIFICATION TO FOLLOW    Report Status PENDING  Incomplete  Urine culture     Status: None (Preliminary result)   Collection Time: 01/12/15  9:45 PM  Result Value Ref Range  Status   Specimen Description URINE, RANDOM  Final   Special Requests NONE  Final   Culture   Final    >=100,000 COLONIES/mL GRAM NEGATIVE RODS IDENTIFICATION AND SUSCEPTIBILITIES TO FOLLOW    Report Status PENDING  Incomplete     Studies: Dg Chest 2 View  01/12/2015  CLINICAL DATA:  Initial valuation for acute fever. EXAM: CHEST  2 VIEW COMPARISON:  None. FINDINGS: Cardiac and mediastinal silhouettes are within normal limits. Tracheal air column midline and patent. Lungs are mildly hypoinflated. Bibasilar linear opacities most consistent with atelectasis. No consolidative airspace disease. No pulmonary edema or pleural effusion. No pneumothorax. No acute osseus abnormality. Mild degenerative changes within the visualized spine. IMPRESSION: 1. Mild bibasilar atelectasis. 2. No other active cardiopulmonary disease. Electronically Signed   By: Jeannine Boga M.D.   On: 01/12/2015 21:24   US Scrotum  01/12/2015  CLINICAL DATA:  Acute onset of left testicular pain and fever. Initial encounter. EXAM: SCROTAL ULTRASOUND DOPPLER ULTRASOUND OF THE TESTICLES TECHNIQUE: Complete ultrasound examination of the testicles, epididymis, and other scrotal structures was performed. Color and spectral Doppler ultrasound were also utilized to evaluate blood flow to the testicles. COMPARISON:  None. FINDINGS: Right testicle Measurements: 4.7 x 2.1 x 3.0 cm. No mass or microlithiasis visualized. Left testicle Measurements: 4.5 x 3.3 x 3.4 cm. No mass or microlithiasis visualized. Right epididymis: A 0.5 cm cyst is noted at the right epididymal head. Left epididymis: Diffusely enlarged, with increased vascularity, compatible with epididymitis. Hydrocele:  A small left-sided hydrocele is noted. Varicocele:  A large patent left-sided varicocele is noted. Pulsed Doppler interrogation of both testes demonstrates normal low resistance arterial and venous waveforms bilaterally. Bowel is noted extending into the left  scrotum. IMPRESSION: 1. Left-sided epididymitis, with diffuse epididymal enlargement and increased vascularity. 2. Bowel noted extending into the left scrotum, reflecting recurrent inguinal hernia. 3. Large patent left-sided varicocele noted. 4. Small left-sided hydrocele seen. 5. 0.5 cm right epididymal head cyst noted. Electronically Signed   By: Garald Balding M.D.   On: 01/12/2015 22:53   [place] Art/ven Flow Abd Pelv Doppler  01/12/2015  CLINICAL DATA:  Acute onset of left testicular pain and fever. Initial encounter. EXAM: SCROTAL ULTRASOUND DOPPLER ULTRASOUND OF THE TESTICLES TECHNIQUE: Complete ultrasound examination of the testicles, epididymis, and other scrotal structures was performed. Color and spectral Doppler ultrasound were also utilized to evaluate blood flow to the testicles. COMPARISON:  None. FINDINGS: Right testicle Measurements: 4.7 x 2.1 x 3.0 cm. No mass or microlithiasis visualized. Left testicle Measurements: 4.5 x 3.3 x 3.4 cm. No mass or microlithiasis visualized. Right epididymis: A 0.5 cm cyst is noted at the  right epididymal head. Left epididymis: Diffusely enlarged, with increased vascularity, compatible with epididymitis. Hydrocele:  A small left-sided hydrocele is noted. Varicocele:  A large patent left-sided varicocele is noted. Pulsed Doppler interrogation of both testes demonstrates normal low resistance arterial and venous waveforms bilaterally. Bowel is noted extending into the left scrotum. IMPRESSION: 1. Left-sided epididymitis, with diffuse epididymal enlargement and increased vascularity. 2. Bowel noted extending into the left scrotum, reflecting recurrent inguinal hernia. 3. Large patent left-sided varicocele noted. 4. Small left-sided hydrocele seen. 5. 0.5 cm right epididymal head cyst noted. Electronically Signed   By: Garald Balding M.D.   On: 01/12/2015 22:53    Scheduled Meds: . cefTRIAXone (ROCEPHIN)  IV  1 g Intravenous Q24H  . enoxaparin (LOVENOX) injection   40 mg Subcutaneous QHS  . finasteride  5 mg Oral Daily  . levofloxacin (LEVAQUIN) IV  500 mg Intravenous Q24H  . sodium chloride  3 mL Intravenous Q12H    Assessment/Plan:  1. Clinical sepsis with epididymitis, leukocytosis, fever and tachycardia. Blood cultures one set growing gram-negative rods. Patient was given IV Rocephin and I added IV Levaquin. Continue to monitor closely. We'll keep in the hospital until temperature curve comes down. 2. 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 Varicocele 5. Hypokalemia replace oral potassium  Code Status:     Code Status Orders        Start     Ordered   01/13/15 0015  Full code   Continuous     01/13/15 0014     Disposition Plan: Home once temperature curve comes down. Case discussed with family at bedside.  Antibiotics:  Rocephin  Levaquin  Time spent: 25 minutes  Loletha Grayer  [facility]

## 2015-01-14 NOTE — Plan of Care (Signed)
Pt has gram - rods in blood culture - dr added IV levaquin.  No c/o pain.  Pt not requiring pain medication.  Pt has bilateral tremors in hands.  Asked if cause had been diagnosed and they believe it could be Parkinson's b/c brother had it. Asked if anyone had discussed repairing inguinal hernia and was told not as long as it's not causing pain.  Continues to run low grade temp - he will stay here until wnl.

## 2015-01-14 NOTE — Care Management Important Message (Signed)
Important Message  Patient Details  Name: Brendan Carter MRN: [id number] Date of Birth: 08-08-1945   Medicare Important Message Given:  Yes    Shelbie Ammons, RN 01/14/2015, 9:38 AM

## 2015-01-14 NOTE — Plan of Care (Signed)
Problem: Physical Regulation: Goal: Signs and symptoms of infection will decrease Outcome: Progressing Pt denies pain. Pt has gram negative rods in blood culture, Dr. Jannifer Franklin is aware. Pt has been resting comfortable during shift. Pt was febrile at the beginning of shift. No other signs of distress noted. Will continue to monitor.

## 2015-01-15 LAB — URINE CULTURE

## 2015-01-15 MED ORDER — CIPROFLOXACIN HCL 500 MG PO TABS: 500.0000 mg | ORAL_TABLET | Freq: Two times a day (BID) | ORAL | Status: DC

## 2015-01-15 MED ORDER — TAMSULOSIN HCL 0.4 MG PO CAPS: 0.4000 mg | ORAL_CAPSULE | Freq: Every day | ORAL | Status: DC

## 2015-01-15 MED ORDER — HYDROCODONE-ACETAMINOPHEN 5-325 MG PO TABS: 1.0000 | ORAL_TABLET | Freq: Four times a day (QID) | ORAL | Status: DC | PRN

## 2015-01-15 NOTE — Progress Notes (Signed)
Pt discharged via wheelchair by nursing to the visitor's entrance 

## 2015-01-15 NOTE — Discharge Summary (Signed)
[facility] [place] NAME: Leroyal Laramore    MR#:  [id number]  DATE OF BIRTH:  Jul 31, 1945  DATE OF ADMISSION:  01/12/2015 ADMITTING PHYSICIAN: Lance Coon, MD  DATE OF DISCHARGE: 01/15/2015  PRIMARY CARE PHYSICIAN: No PCP Per Patient    ADMISSION DIAGNOSIS:  Pyelonephritis [N12] Scrotal pain [N50.82] Fever [R50.9]  DISCHARGE DIAGNOSIS:  Principal Problem:   Sepsis ([place]) Active Problems:   Complicated UTI (urinary tract infection)   Epididymitis   BPH with elevated PSA   SECONDARY DIAGNOSIS:   Past Medical History  Diagnosis Date  . Elevated PSA   . Hydrocele   . Prostatitis   . Inguinal hernia     left  . BPH (benign prostatic hyperplasia)   . Weak urinary stream   . Hypertension     HOSPITAL COURSE:   1. Clinical sepsis present on admission with fever, tachycardia and leukocytosis. Source of fever is epididymitis on the left and acute cystitis. Blood cultures and urine culture grew out Escherichia coli. Patient was on IV Rocephin and Levaquin was added. I would switch over to by mouth Cipro upon discharge since the MIC is very good. 2. BPH without urinary shock Chen on finasteride at home, and Flomax 3. 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 Left varicocele 5. Hypokalemia replaced   DISCHARGE CONDITIONS:   Satisfactory  CONSULTS OBTAINED:  None  DRUG ALLERGIES:  No Known Allergies  DISCHARGE MEDICATIONS:   Current Discharge Medication List    START taking these medications   Details  ciprofloxacin (CIPRO) 500 MG tablet Take 1 tablet (500 mg total) by mouth 2 (two) times daily. Qty: 24 tablet, Refills: 0    HYDROcodone-acetaminophen (NORCO/VICODIN) 5-325 MG tablet Take 1 tablet by mouth every 6 (six) hours as needed for moderate pain. Qty: 20 tablet, Refills: 0    tamsulosin (FLOMAX) 0.4 MG CAPS capsule Take 1 capsule (0.4 mg total) by mouth daily after supper. Qty: 30 capsule,  Refills: 0      CONTINUE these medications which have NOT CHANGED   Details  finasteride (PROSCAR) 5 MG tablet Take 1 tablet (5 mg total) by mouth daily. Qty: 90 tablet, Refills: 3   Associated Diagnoses: BPH with obstruction/lower urinary tract symptoms      STOP taking these medications     Chlorphen-Phenyleph-ASA (ALKA-SELTZER PLUS COLD PO)          DISCHARGE INSTRUCTIONS:   Follow-up with your PMD one week Patient also has appointment with urologist as outpatient  If you experience worsening of your admission symptoms, develop shortness of breath, life threatening emergency, suicidal or homicidal thoughts you must seek medical attention immediately by calling 911 or calling your MD immediately  if symptoms less severe.  You Must read complete instructions/literature along with all the possible adverse reactions/side effects for all the Medicines you take and that have been prescribed to you. Take any new Medicines after you have completely understood and accept all the possible adverse reactions/side effects.   Please note  You were cared for by a hospitalist during your hospital stay. If you have any questions about your discharge medications or the care you received while you were in the hospital after you are discharged, you can call the unit and asked to speak with the hospitalist on call if the hospitalist that took care of you is not available. Once you are discharged, your primary care physician will handle any further medical issues. Please  note that NO REFILLS for any discharge medications will be authorized once you are discharged, as it is imperative that you return to your primary care physician (or establish a relationship with a primary care physician if you do not have one) for your aftercare needs so that they can reassess your need for medications and monitor your lab values.    Today   CHIEF COMPLAINT:   Chief Complaint  Patient presents with  . Groin  Swelling  . Groin Pain    HISTORY OF PRESENT ILLNESS:  Brendan Carter  is a 69 y.o. male presented with a groin pain and swelling and found to have left epididymitis.   VITAL SIGNS:  Blood pressure 120/76, pulse 85, temperature 99.5 F (37.5 C), temperature source Oral, resp. rate 18, height 5\' 8"  (1.727 m), weight 64.819 kg (142 lb 14.4 oz), SpO2 100 %.    PHYSICAL EXAMINATION:  GENERAL:  69 y.o.-year-old patient lying in the bed with no acute distress.  EYES: Pupils equal, round, reactive to light and accommodation. No scleral icterus. Extraocular muscles intact.  HEENT: Head atraumatic, normocephalic. Oropharynx and nasopharynx clear.  NECK:  Supple, no jugular venous distention. No thyroid enlargement, no tenderness.  LUNGS: Normal breath sounds bilaterally, no wheezing, rales,rhonchi or crepitation. No use of accessory muscles of respiration.  CARDIOVASCULAR: S1, S2 normal. No murmurs, rubs, or gallops.  ABDOMEN: Soft, non-tender, non-distended. Bowel sounds present. No organomegaly or mass.  EXTREMITIES: No pedal edema, cyanosis, or clubbing.  NEUROLOGIC: Cranial nerves II through XII are intact. Muscle strength 5/5 in all extremities. Sensation intact. Gait not checked.  PSYCHIATRIC: The patient is alert and oriented x 3.  SKIN: No obvious rash, lesion, or ulcer.  GU exam: Patient not tender when I'm pressing on his left testicle. I think the fullness there is the varicocele.  DATA REVIEW:   CBC  Recent Labs Lab 01/13/15 0627  WBC 18.8*  HGB 12.0*  HCT 35.6*  PLT 145*    Chemistries   Recent Labs Lab 01/12/15 2103  01/14/15 0706  NA 136  < > 136  K 3.7  < > 3.8  CL 102  < > 106  CO2 27  < > 25  GLUCOSE 175*  < > 112*  BUN 20  < > 17  CREATININE 1.15  < > 1.02  CALCIUM 8.7*  < > 8.1*  AST 25  --   --   ALT 21  --   --   ALKPHOS 78  --   --   BILITOT 0.6  --   --   < > = values in this interval not displayed.   Microbiology Results  Results for orders  placed or performed during the hospital encounter of 01/12/15  Culture, blood (routine x 2)     Status: None (Preliminary result)   Collection Time: 01/12/15  8:47 PM  Result Value Ref Range Status   Specimen Description BLOOD  Final   Special Requests BOTTLES DRAWN AEROBIC AND ANAEROBIC 5CC  Final   Culture NO GROWTH 3 DAYS  Final   Report Status PENDING  Incomplete  Culture, blood (routine x 2)     Status: None (Preliminary result)   Collection Time: 01/12/15  9:03 PM  Result Value Ref Range Status   Specimen Description BLOOD  Final   Special Requests BOTTLES DRAWN AEROBIC AND ANAEROBIC 4CC  Final   Culture  Setup Time   Final    GRAM NEGATIVE RODS ANAEROBIC BOTTLE ONLY  CRITICAL RESULT CALLED TO, READ BACK BY AND VERIFIED WITH: CASEY ZAUGHANSINWARD AT 2159 01/13/15 MLZ  CONFIRMED BY SDR     Culture ESCHERICHIA COLI ANAEROBIC BOTTLE ONLY   Final   Report Status PENDING  Incomplete   Organism ID, Bacteria ESCHERICHIA COLI  Final      Susceptibility   Escherichia coli - MIC*    AMPICILLIN 4 SENSITIVE Sensitive     CEFTAZIDIME <=1 SENSITIVE Sensitive     CEFAZOLIN <=4 SENSITIVE Sensitive     CEFTRIAXONE <=1 SENSITIVE Sensitive     CIPROFLOXACIN <=0.25 SENSITIVE Sensitive     GENTAMICIN <=1 SENSITIVE Sensitive     IMIPENEM <=0.25 SENSITIVE Sensitive     TRIMETH/SULFA <=20 SENSITIVE Sensitive     PIP/TAZO Value in next row Sensitive      SENSITIVE<=4    * ESCHERICHIA COLI  Urine culture     Status: None   Collection Time: 01/12/15  9:45 PM  Result Value Ref Range Status   Specimen Description URINE, RANDOM  Final   Special Requests NONE  Final   Culture >=100,000 COLONIES/mL ESCHERICHIA COLI  Final   Report Status 01/15/2015 FINAL  Final   Organism ID, Bacteria ESCHERICHIA COLI  Final      Susceptibility   Escherichia coli - MIC*    AMPICILLIN 4 SENSITIVE Sensitive     CEFTAZIDIME <=1 SENSITIVE Sensitive     CEFAZOLIN <=4 SENSITIVE Sensitive     CEFTRIAXONE <=1  SENSITIVE Sensitive     CIPROFLOXACIN <=0.25 SENSITIVE Sensitive     GENTAMICIN <=1 SENSITIVE Sensitive     IMIPENEM <=0.25 SENSITIVE Sensitive     TRIMETH/SULFA <=20 SENSITIVE Sensitive     NITROFURANTOIN Value in next row Sensitive      SENSITIVE<=16    PIP/TAZO Value in next row Sensitive      SENSITIVE<=4    LEVOFLOXACIN Value in next row Sensitive      SENSITIVE<=0.12    * >=100,000 COLONIES/mL ESCHERICHIA COLI    Management plans discussed with the patient, family and they are in agreement.  CODE STATUS:     Code Status Orders        Start     Ordered   01/13/15 0015  Full code   Continuous     01/13/15 0014      TOTAL TIME TAKING CARE OF THIS PATIENT: 35 minutes.    Loletha Grayer M.D on 01/15/2015 at 12:24 PM  Between 7am to 6pm - Pager - [phone]  After 6pm go to [email or web address] - password [email or web address] [facility]  [facility]  Office  [phone]  CC: Primary care physician; No PCP Per Patient

## 2015-01-15 NOTE — Progress Notes (Signed)
MD order received to discharge pt home today; verbally reviewed CH[facility] AVS with pt including medications/gave Rxs to pt; diet; activity level and follow up appointments; no questions voiced at this time; pt to call when he is dressed and ready for his wheelchair

## 2015-01-15 NOTE — Plan of Care (Signed)
Problem: Physical Regulation: Goal: Signs and symptoms of infection will decrease Outcome: Not Progressing Pt resting comfortably thorughout shift, no complaints of pain low grade temp up to 99.5 noted this shift no prn requirements

## 2015-01-17 LAB — CULTURE, BLOOD (ROUTINE X 2): CULTURE: NO GROWTH

## 2015-01-18 LAB — CULTURE, BLOOD (ROUTINE X 2)

## 2015-01-25 DIAGNOSIS — A4151 Sepsis due to Escherichia coli [E. coli]: Secondary | ICD-10-CM | POA: Diagnosis not present

## 2015-01-25 DIAGNOSIS — N401 Enlarged prostate with lower urinary tract symptoms: Secondary | ICD-10-CM | POA: Diagnosis not present

## 2015-01-25 DIAGNOSIS — N451 Epididymitis: Secondary | ICD-10-CM | POA: Diagnosis not present

## 2015-01-25 DIAGNOSIS — K4090 Unilateral inguinal hernia, without obstruction or gangrene, not specified as recurrent: Secondary | ICD-10-CM | POA: Diagnosis not present

## 2015-02-02 ENCOUNTER — Other Ambulatory Visit: Payer: Medicare Other

## 2015-02-02 DIAGNOSIS — R972 Elevated prostate specific antigen [PSA]: Principal | ICD-10-CM

## 2015-02-02 DIAGNOSIS — N400 Enlarged prostate without lower urinary tract symptoms: Secondary | ICD-10-CM | POA: Diagnosis not present

## 2015-02-03 LAB — PSA: Prostate Specific Ag, Serum: 20.6 ng/mL — ABNORMAL HIGH (ref 0.0–4.0)

## 2015-02-04 ENCOUNTER — Telehealth: Payer: Self-pay

## 2015-02-04 ENCOUNTER — Ambulatory Visit (INDEPENDENT_AMBULATORY_CARE_PROVIDER_SITE_OTHER): Payer: Medicare Other | Admitting: Urology

## 2015-02-04 ENCOUNTER — Telehealth: Payer: Self-pay | Admitting: Urology

## 2015-02-04 ENCOUNTER — Encounter: Payer: Self-pay | Admitting: Urology

## 2015-02-04 VITALS — BP 117/73 | HR 73 | Ht 68.0 in | Wt 143.9 lb

## 2015-02-04 DIAGNOSIS — N432 Other hydrocele: Secondary | ICD-10-CM | POA: Diagnosis not present

## 2015-02-04 DIAGNOSIS — N401 Enlarged prostate with lower urinary tract symptoms: Secondary | ICD-10-CM | POA: Diagnosis not present

## 2015-02-04 DIAGNOSIS — R972 Elevated prostate specific antigen [PSA]: Secondary | ICD-10-CM | POA: Insufficient documentation

## 2015-02-04 DIAGNOSIS — N451 Epididymitis: Secondary | ICD-10-CM | POA: Diagnosis not present

## 2015-02-04 DIAGNOSIS — N138 Other obstructive and reflux uropathy: Secondary | ICD-10-CM

## 2015-02-04 DIAGNOSIS — N400 Enlarged prostate without lower urinary tract symptoms: Secondary | ICD-10-CM | POA: Diagnosis not present

## 2015-02-04 LAB — BLADDER SCAN AMB NON-IMAGING: SCAN RESULT: 85

## 2015-02-04 NOTE — Telephone Encounter (Signed)
Can you schedule this?

## 2015-02-04 NOTE — Telephone Encounter (Signed)
Pt has already been seen today. 

## 2015-02-04 NOTE — Telephone Encounter (Signed)
Patient was seen in the ED the last week of November for epididmytis.  He is still having discomfort and induration.  I am going to repeat the ultrasound, but I would like him on an antibiotic.  Would you call him in Septra DS?  Septra DS one tablet twice daily for 14 days, #28.

## 2015-02-04 NOTE — Progress Notes (Signed)
11:32 AM   Brendan Carter, Brendan Carter [id number]  Referring provider: No referring provider defined for this encounter.  Chief Complaint  Patient presents with  . Benign Prostatic Hypertrophy    6 month follow up  . Elevated PSA    HPI: Patient is a 69 year old African American male with an elevated PSA, hydroceles and BPH with LUTS for his 6 months follow up.    Elevated PSA Patient's most recent PSA is 20.6 ng/mL on 02/02/2015.  His last PSA was 8.8 ng/mL 6 months ago.  He has massive BPH and he has been on finasteride.  This elevation is concerning, but he has recently been treated for epididymitis.  We are repeated the test today.    Hydroceles Patient was recently seen in the ED at [facility] on 01/15/2015 and placed on Cipro for left epididymitis.  Patient is a poor historian and I received this information through EPIC.  He is still having left testicular tenderness.  BPH WITH LUTS His IPSS score today is 4, which is mild lower urinary tract symptomatology. He is mixed with his quality life due to his urinary symptoms. His PVR is 85 mL.  He denies any dysuria, hematuria or suprapubic pain.  He currently taking tamsulosin and finasteride.   He also denies any recent fevers, chills, nausea or vomiting.   He does not have a family history of PCa.      IPSS      02/04/15 0800       International Prostate Symptom Score   How often have you had the sensation of not emptying your bladder? Not at All     How often have you had to urinate less than every two hours? Less than 1 in 5 times     How often have you found you stopped and started again several times when you urinated? Not at All     How often have you found it difficult to postpone urination? Less than 1 in 5 times     How often have you had a weak urinary stream? Not at All     How often have you had to strain to start urination? Not at All     How many times did you typically get up at night to urinate? 2 Times     Total IPSS Score 4     Quality of Life due to urinary symptoms   If you were to spend the rest of your life with your urinary condition just the way it is now how would you feel about that? Mixed        Score:  1-7 Mild 8-19 Moderate 20-35 Severe    PMH: Past Medical History  Diagnosis Date  . Elevated PSA   . Hydrocele   . Prostatitis   . Inguinal hernia     left  . BPH (benign prostatic hyperplasia)   . Weak urinary stream   . Hypertension     Surgical History: Past Surgical History  Procedure Laterality Date  . Hydrocelectomy Right 2013  . Hernia repair Right     Home Medications:    Medication List       This list is accurate as of: 02/04/15 11:32 AM.  Always use your most recent med list.               ciprofloxacin 500 MG tablet  Commonly known as:  CIPRO  Take 1 tablet (500 mg total) by mouth 2 (  two) times daily.     finasteride 5 MG tablet  Commonly known as:  PROSCAR  Take 1 tablet (5 mg total) by mouth daily.     HYDROcodone-acetaminophen 5-325 MG tablet  Commonly known as:  NORCO/VICODIN  Take 1 tablet by mouth every 6 (six) hours as needed for moderate pain.     tamsulosin 0.4 MG Caps capsule  Commonly known as:  FLOMAX  Take 1 capsule (0.4 mg total) by mouth daily after supper.        Allergies: No Known Allergies  Family History: Family History  Problem Relation Age of Onset  . Kidney cancer Neg Hx   . Kidney disease Neg Hx   . Prostate cancer Neg Hx     Social History:  reports that he has never smoked. He does not have any smokeless tobacco history on file. He reports that he does not drink alcohol or use illicit drugs.  ROS: Urological Symptom Review  Patient is experiencing the following symptoms: No GU complaints at this time.   Review of Systems  Gastrointestinal (upper)  : Negative for upper GI symptoms  Gastrointestinal (lower) : Negative for lower GI symptoms  Constitutional : Negative for  symptoms  Skin: Negative for skin symptoms  Eyes: Negative for eye symptoms  Ear/Nose/Throat : Negative for Ear/Nose/Throat symptoms  Hematologic/Lymphatic: Negative for Hematologic/Lymphatic symptoms  Cardiovascular : Negative for cardiovascular symptoms  Respiratory : Negative for respiratory symptoms  Endocrine: Negative for endocrine symptoms  Musculoskeletal: Negative for musculoskeletal symptoms  Neurological: Negative for neurological symptoms  Psychologic: Negative for psychiatric symptoms   Physical Exam: BP 117/73 mmHg  Pulse 73  Ht 5\' 8"  (1.727 m)  Wt 143 lb 14.4 oz (65.273 kg)  BMI 21.89 kg/m2  GU: Patient with uncircumcised phallus. Foreskin easily retracted.  Urethral stenosis is noted.    No penile discharge. No penile lesions or rashes. Scrotum without lesions, cysts, rashes and/or edema.  Patient with bilateral hydroceles, left larger than the right.  Testicles are located scrotally bilaterally. No masses are appreciated in the testicles.  Right epididymis is normal.  Left epididymis is tender and indurated.   Rectal: Patient with  normal sphincter tone. Perineum without scarring or rashes. No rectal masses are appreciated. Prostate is approximately 100 grams, could not palpate the entire gland.     Laboratory Data: Lab Results  Component Value Date   WBC 18.8* 01/13/2015   HGB 12.0* 01/13/2015   HCT 35.6* 01/13/2015   MCV 100.6* 01/13/2015   PLT 145* 01/13/2015    Lab Results  Component Value Date   CREATININE 1.Carter 01/14/2015  PSA History:               11.3 ng/mL on 11/27/2011.                07.8 ng/mL on 08/06/2012             10.5 ng/mL on 02/05/2013             08.2 ng/mL on 08/06/2013             08.7 ng/mL on 02/05/2014   Lab Results  Component Value Date   PSA 20.6* 02/02/2015   PSA 8.8* 08/05/2014   Pertinent Imaging: BLADDER SCAN AMB NON-IMAGING  Status: Finalresult Visible to patient:  Not Released Nextappt:  02/28/2015 at 09:15 AM in Urology ([place]) Dx:  BPH (benign prostatic hyperplasia)         8:56 AM  Scan Result 85       Last Resulted: 02/04/15 8:56 AM                    CLINICAL DATA: Acute onset of left testicular pain and fever. Initial encounter.  EXAM: SCROTAL ULTRASOUND  DOPPLER ULTRASOUND OF THE TESTICLES  TECHNIQUE: Complete ultrasound examination of the testicles, epididymis, and other scrotal structures was performed. Color and spectral Doppler ultrasound were also utilized to evaluate blood flow to the testicles.  COMPARISON: None.  FINDINGS: Right testicle  Measurements: 4.7 x 2.1 x 3.0 cm. No mass or microlithiasis visualized.  Left testicle  Measurements: 4.5 x 3.3 x 3.4 cm. No mass or microlithiasis visualized.  Right epididymis: A 0.5 cm cyst is noted at the right epididymal head.  Left epididymis: Diffusely enlarged, with increased vascularity, compatible with epididymitis.  Hydrocele: A small left-sided hydrocele is noted.  Varicocele: A large patent left-sided varicocele is noted.  Pulsed Doppler interrogation of both testes demonstrates normal low resistance arterial and venous waveforms bilaterally.  Bowel is noted extending into the left scrotum.  IMPRESSION: 1. Left-sided epididymitis, with diffuse epididymal enlargement and increased vascularity. 2. Bowel noted extending into the left scrotum, reflecting recurrent inguinal hernia. 3. Large patent left-sided varicocele noted. 4. Small left-sided hydrocele seen. 5. 0.5 cm right epididymal head cyst noted.   Electronically Signed  By: Garald Balding M.D.  On: 01/12/2015 22:53  Assessment & Plan:    1. Elevated PSA-  Patient has had a h/o elevated PSA for the last 10 yrs.  His PSA's have been in the 8 to 10 range, but recently it has risen to 20.6 ng/mL on 02/02/2015.  I have repeated the PSA today to confirm the  result.  If it is a true value, he will need to have an appointment with one of the physicians to discuss his next step.    - PSA drawn today  2. BPH with LUTS-  Patient has massive BPH, but his IPSS score 4/3 (mild).   His PVR is 85 mL.  He is currently being evaluated for an elevated PSA.  If it returns to baseline, we will repeat his IPSS score, exam and PSA.    3. 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    4. Left epididymitis:   He had recently been seen in the ED for epididymitis.   I have extended antibiotic coverage with Bactrim. He will return for recheck in two weeks.     Return for scrotal ultrasound report.  Zara Council, [facility] [address] [place], [place] [address] [phone]

## 2015-02-04 NOTE — Telephone Encounter (Signed)
-----   Message from Nori Riis, PA-C sent at 02/03/2015  8:42 AM EST ----- Patient's PSA has increased significantly.  We will repeat it when he returns for his appointment on the 16th.

## 2015-02-04 NOTE — Telephone Encounter (Signed)
Also, I have cancelled his scrotal ultrasound.  He had one done in the ED.  I would like to see him back in two weeks.

## 2015-02-05 LAB — PSA: Prostate Specific Ag, Serum: 16.4 ng/mL — ABNORMAL HIGH (ref 0.0–4.0)

## 2015-02-07 ENCOUNTER — Telehealth: Payer: Self-pay

## 2015-02-07 NOTE — Telephone Encounter (Signed)
Spoke with pt in reference to PSA. Pt voiced understanding.  

## 2015-02-07 NOTE — Telephone Encounter (Signed)
-----   Message from Nori Riis, PA-C sent at 02/06/2015  8:12 PM EST ----- Patient's PSA has lowered. He should be coming in 2 weeks. He can see me or Ria Comment. He will need a recheck of his epididymis and a repeat to make sure his PSA continues a downward trend.

## 2015-02-07 NOTE — Telephone Encounter (Signed)
He has a follow up schd already for the 9th is this ok?  Brendan Carter

## 2015-02-12 NOTE — Telephone Encounter (Signed)
That is fine 

## 2015-02-28 ENCOUNTER — Ambulatory Visit: Payer: Medicare Other

## 2015-03-03 ENCOUNTER — Ambulatory Visit (INDEPENDENT_AMBULATORY_CARE_PROVIDER_SITE_OTHER): Payer: Medicare Other | Admitting: Urology

## 2015-03-03 VITALS — BP 103/71 | HR 102 | Ht 68.0 in | Wt 141.2 lb

## 2015-03-03 DIAGNOSIS — R972 Elevated prostate specific antigen [PSA]: Secondary | ICD-10-CM | POA: Diagnosis not present

## 2015-03-03 DIAGNOSIS — N401 Enlarged prostate with lower urinary tract symptoms: Secondary | ICD-10-CM

## 2015-03-03 DIAGNOSIS — N432 Other hydrocele: Secondary | ICD-10-CM

## 2015-03-03 DIAGNOSIS — K4090 Unilateral inguinal hernia, without obstruction or gangrene, not specified as recurrent: Secondary | ICD-10-CM

## 2015-03-03 DIAGNOSIS — N138 Other obstructive and reflux uropathy: Secondary | ICD-10-CM

## 2015-03-03 LAB — URINALYSIS, COMPLETE
BILIRUBIN UA: NEGATIVE
Glucose, UA: NEGATIVE
KETONES UA: NEGATIVE
LEUKOCYTES UA: NEGATIVE
Nitrite, UA: NEGATIVE
PH UA: 5.5 (ref 5.0–7.5)
PROTEIN UA: NEGATIVE
SPEC GRAV UA: 1.025 (ref 1.005–1.030)
UUROB: 0.2 mg/dL (ref 0.2–1.0)

## 2015-03-03 LAB — MICROSCOPIC EXAMINATION: Bacteria, UA: NONE SEEN

## 2015-03-03 NOTE — Progress Notes (Signed)
03/03/2015 2:46 PM   [name] 05-25-1945 [id number]  Referring provider: No referring provider defined for this encounter.  Chief Complaint  Patient presents with  . Results    scrotal u/s results    HPI: Patient is a 70 year old African American male with an elevated PSA, hydroceles and BPH with LUTS who presents for follow up of left epidermidis. The patient is a very poor historian and is unable to tell me if things have improved. In regards to his epididymitis, he states that he has off his antibiotic. He states he is no more pain. He does note there is still swelling. He is unable to tell me if it is improved worsen or stayed the same.  Elevated PSA Patient's most recent PSA is 20.6 ng/mL on 02/02/2015 with 16.2 on 02/06/15. His last PSA was 8.8 ng/mL 6 months ago. 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 This elevation is concerning, but he has recently been treated for epididymitis.  Hydroceles Patient was recently seen in the ED at [facility] on 01/15/2015 and placed on Cipro for left epididymitis. Patient is a poor historian and I received this information through EPIC. He is still having left testicular tenderness. He also has a left inguinal hernia.   BPH WITH LUTS His IPSS score is 4 at his last visit. which is mild lower urinary tract symptomatology. His PVR is 85 mL.  He currently taking tamsulosin and finasteride. He also denies any recent fevers, chills, nausea or vomiting. He does not have a family history of PCa.    11.3 ng/mL on 11/27/2011.   07.8 ng/mL on 08/06/2012  10.5 ng/mL on 02/05/2013  08.2 ng/mL on 08/06/2013  08.7 ng/mL on 02/05/2014  20.6 ng/mL on 02/04/2015  16.2 ng/mL on 02/06/2015   PMH: Past Medical History  Diagnosis Date  . Elevated PSA   . Hydrocele   . Prostatitis   . Inguinal hernia     left  . BPH (benign prostatic hyperplasia)   .  Weak urinary stream   . Hypertension     Surgical History: Past Surgical History  Procedure Laterality Date  . Hydrocelectomy Right 2013  . Hernia repair Right     Home Medications:    Medication List       This list is accurate as of: 03/03/15  2:46 PM.  Always use your most recent med list.               finasteride 5 MG tablet  Commonly known as:  PROSCAR  Take 1 tablet (5 mg total) by mouth daily.        Allergies: No Known Allergies  Family History: Family History  Problem Relation Age of Onset  . Kidney cancer Neg Hx   . Kidney disease Neg Hx   . Prostate cancer Neg Hx     Social History:  reports that he has never smoked. He does not have any smokeless tobacco history on file. He reports that he does not drink alcohol or use illicit drugs.  ROS: UROLOGY Frequent Urination?: No Hard to postpone urination?: No Burning/pain with urination?: No Get up at night to urinate?: No Leakage of urine?: No Urine stream starts and stops?: No Trouble starting stream?: No Do you have to strain to urinate?: No Blood in urine?: No Urinary tract infection?: No Sexually transmitted disease?: No Injury to kidneys or bladder?: No Painful intercourse?: No Weak stream?: No Erection problems?:  No Penile pain?: No  Gastrointestinal Nausea?: No Vomiting?: No Indigestion/heartburn?: No Diarrhea?: No Constipation?: No  Constitutional Fever: No Night sweats?: Yes Weight loss?: No Fatigue?: No  Skin Skin rash/lesions?: No Itching?: No  Eyes Blurred vision?: No Double vision?: No  Ears/Nose/Throat Sore throat?: No Sinus problems?: No  Hematologic/Lymphatic Swollen glands?: No Easy bruising?: No  Cardiovascular Leg swelling?: No Chest pain?: No  Respiratory Cough?: No Shortness of breath?: No  Endocrine Excessive thirst?: No  Musculoskeletal Back pain?: No Joint pain?: No  Neurological Headaches?: No Dizziness?:  No  Psychologic Depression?: No Anxiety?: No  Physical Exam: BP 103/71 mmHg  Pulse 102  Ht 5\' 8"  (1.727 m)  Wt 141 lb 3.2 oz (64.048 kg)  BMI 21.47 kg/m2  Constitutional:  Alert and oriented, No acute distress. HEENT: [place] AT, moist mucus membranes.  Trachea midline, no masses. Cardiovascular: No clubbing, cyanosis, or edema. Respiratory: Normal respiratory effort, no increased work of breathing. GI: Abdomen is soft, nontender, nondistended, no abdominal masses GU: No CVA tenderness.  Skin: No rashes, bruises or suspicious lesions. Lymph: No cervical or inguinal adenopathy. Neurologic: Grossly intact, no focal deficits, moving all 4 extremities. Psychiatric: Normal mood and affect.  Laboratory Data: Lab Results  Component Value Date   WBC 18.8* 01/13/2015   HGB 12.0* 01/13/2015   HCT 35.6* 01/13/2015   MCV 100.6* 01/13/2015   PLT 145* 01/13/2015    Lab Results  Component Value Date   CREATININE 1.02 01/14/2015    No results found for: PSA  No results found for: TESTOSTERONE  No results found for: HGBA1C  Urinalysis    Component Value Date/Time   COLORURINE YELLOW* 01/12/2015 2145   APPEARANCEUR HAZY* 01/12/2015 2145   LABSPEC 1.023 01/12/2015 2145   PHURINE 6.0 01/12/2015 2145   GLUCOSEU >500* 01/12/2015 2145   HGBUR 2+* 01/12/2015 2145   BILIRUBINUR NEGATIVE 01/12/2015 2145   KETONESUR TRACE* 01/12/2015 2145   PROTEINUR 100* 01/12/2015 2145   NITRITE POSITIVE* 01/12/2015 2145   LEUKOCYTESUR TRACE* 01/12/2015 2145     Assessment & Plan:   1. Elevated PSA- Patient has had a h/o elevated PSA for the last 10 yrs. His PSA's have been in the 8 to 10 range, but recently it has risen to 20.6 ng/mL on 02/02/2015 and 16.2 on 02/06/15.  Will recheck in 3 months due to recent infection.  2. BPH with LUTS- Patient has massive BPH, but his IPSS score 4/3 (mild). His PVR is 85 mL. continue Flomax and finasteride.  3. Hydroceles- Patient has bilaterally  hydroceles which he is managing them with conservative measures. He also has a left inguinal hernia.  4. Left epididymitis: This appears to be resolving.No evidence of current infection.   Nickie Retort, MD  [facility] [address], [place], [place] [address] [phone]

## 2015-05-25 ENCOUNTER — Other Ambulatory Visit: Payer: Medicare Other

## 2015-05-25 DIAGNOSIS — N138 Other obstructive and reflux uropathy: Secondary | ICD-10-CM

## 2015-05-25 DIAGNOSIS — N401 Enlarged prostate with lower urinary tract symptoms: Secondary | ICD-10-CM | POA: Diagnosis not present

## 2015-05-26 LAB — PSA: Prostate Specific Ag, Serum: 10.7 ng/mL — ABNORMAL HIGH (ref 0.0–4.0)

## 2015-06-01 ENCOUNTER — Encounter: Payer: Self-pay | Admitting: Urology

## 2015-06-01 ENCOUNTER — Ambulatory Visit (INDEPENDENT_AMBULATORY_CARE_PROVIDER_SITE_OTHER): Payer: Medicare Other | Admitting: Urology

## 2015-06-01 VITALS — BP 129/82 | HR 73 | Ht 66.0 in | Wt 142.5 lb

## 2015-06-01 DIAGNOSIS — N451 Epididymitis: Secondary | ICD-10-CM | POA: Diagnosis not present

## 2015-06-01 DIAGNOSIS — R972 Elevated prostate specific antigen [PSA]: Secondary | ICD-10-CM

## 2015-06-01 DIAGNOSIS — N138 Other obstructive and reflux uropathy: Secondary | ICD-10-CM

## 2015-06-01 DIAGNOSIS — N401 Enlarged prostate with lower urinary tract symptoms: Secondary | ICD-10-CM

## 2015-06-01 NOTE — Progress Notes (Signed)
06/01/2015 9:33 AM   Brendan Carter 14-Aug-1945 [id number]  Referring provider: No referring provider defined for this encounter.  Chief Complaint  Patient presents with  . Follow-up    BPH, elevated PSA and Left epididymis    HPI: Patient is a 70 year old African American male with an elevated PSA, hydroceles and BPH with LUTS who presents for follow up.  Elevated PSA Patient's PSA was 20.6 ng/mL on 02/02/2015 and 16.2 on 02/06/15. His last PSA was 8.8 ng/mL 6 months ago. 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 It decreased to 10.7 ng/mL in April 2017.  Hydroceles Patient was recently seen in the ED at [facility] on 01/15/2015 and placed on Cipro for left epididymitis. This has since resolved.  BPH WITH LUTS His IPSS score is 4/1. He currently taking tamsulosin and finasteride. He also denies any recent fevers, chills, nausea or vomiting. He does not have a family history of PCa.   11.3 ng/mL on 11/27/2011.   07.8 ng/mL on 08/06/2012  10.5 ng/mL on 02/05/2013  08.2 ng/mL on 08/06/2013  08.7 ng/mL on 02/05/2014 20.6 ng/mL on 02/04/2015 16.4 ng/mL on 02/06/2015  10.7 ng/mL on 05/25/2015   PMH: Past Medical History  Diagnosis Date  . Elevated PSA   . Hydrocele   . Prostatitis   . Inguinal hernia     left  . BPH (benign prostatic hyperplasia)   . Weak urinary stream   . Hypertension     Surgical History: Past Surgical History  Procedure Laterality Date  . Hydrocelectomy Right 2013  . Hernia repair Right     Home Medications:    Medication List       This list is accurate as of: 06/01/15  9:33 AM.  Always use your most recent med list.               finasteride 5 MG tablet  Commonly known as:  PROSCAR  Take 1 tablet (5 mg total) by mouth daily.        Allergies: No Known Allergies  Family History: Family History  Problem Relation  Age of Onset  . Kidney cancer Neg Hx   . Kidney disease Neg Hx   . Prostate cancer Neg Hx     Social History:  reports that he has never smoked. He does not have any smokeless tobacco history on file. He reports that he does not drink alcohol or use illicit drugs.  ROS: UROLOGY Frequent Urination?: No Hard to postpone urination?: No Burning/pain with urination?: No Get up at night to urinate?: No Leakage of urine?: No Urine stream starts and stops?: No Trouble starting stream?: No Do you have to strain to urinate?: No Blood in urine?: No Urinary tract infection?: No Sexually transmitted disease?: No Injury to kidneys or bladder?: No Painful intercourse?: No Weak stream?: No Erection problems?: No Penile pain?: No  Gastrointestinal Nausea?: No Vomiting?: No Indigestion/heartburn?: No Diarrhea?: No Constipation?: No  Constitutional Fever: No Night sweats?: No Weight loss?: No Fatigue?: No  Skin Skin rash/lesions?: No Itching?: No  Eyes Blurred vision?: No Double vision?: No  Ears/Nose/Throat Sore throat?: No Sinus problems?: No  Hematologic/Lymphatic Swollen glands?: No Easy bruising?: No  Cardiovascular Leg swelling?: No Chest pain?: No  Respiratory Cough?: No Shortness of breath?: No  Endocrine Excessive thirst?: No  Musculoskeletal Back pain?: No Joint pain?: No  Neurological Headaches?: No Dizziness?: No  Psychologic Depression?: No Anxiety?: No  Physical Exam:  BP 129/82 mmHg  Pulse 73  Ht 5\' 6"  (1.676 m)  Wt 142 lb 8 oz (64.638 kg)  BMI 23.01 kg/m2  Constitutional:  Alert and oriented, No acute distress. HEENT: [place] AT, moist mucus membranes.  Trachea midline, no masses. Cardiovascular: No clubbing, cyanosis, or edema. Respiratory: Normal respiratory effort, no increased work of breathing. GI: Abdomen is soft, nontender, nondistended, no abdominal masses GU: No CVA tenderness. Bilateral testicles nontender to palpation.  Bilateral hydroceles present. Left greater than right. Skin: No rashes, bruises or suspicious lesions. Lymph: No cervical or inguinal adenopathy. Neurologic: Grossly intact, no focal deficits, moving all 4 extremities. Psychiatric: Normal mood and affect.  Laboratory Data: Lab Results  Component Value Date   WBC 18.8* 01/13/2015   HGB 12.0* 01/13/2015   HCT 35.6* 01/13/2015   MCV 100.6* 01/13/2015   PLT 145* 01/13/2015    Lab Results  Component Value Date   CREATININE 1.02 01/14/2015    No results found for: PSA  No results found for: TESTOSTERONE  No results found for: HGBA1C  Urinalysis    Component Value Date/Time   COLORURINE YELLOW* 01/12/2015 2145   APPEARANCEUR Clear 03/03/2015 1449   APPEARANCEUR HAZY* 01/12/2015 2145   LABSPEC 1.023 01/12/2015 2145   PHURINE 6.0 01/12/2015 2145   GLUCOSEU Negative 03/03/2015 1449   HGBUR 2+* 01/12/2015 2145   BILIRUBINUR Negative 03/03/2015 [place] 01/12/2015 2145   KETONESUR TRACE* 01/12/2015 2145   PROTEINUR Negative 03/03/2015 1449   PROTEINUR 100* 01/12/2015 2145   NITRITE Negative 03/03/2015 1449   NITRITE POSITIVE* 01/12/2015 2145   LEUKOCYTESUR Negative 03/03/2015 1449   LEUKOCYTESUR TRACE* 01/12/2015 2145    Assessment & Plan:    1. Elevated PSA- Patient has had a h/o elevated PSA for the last 10 yrs. His PSA's have been in the 8 to 10 range, but recently it has risen to 20.6 ng/mL on 02/02/2015 and 16.2 on 02/06/15. It decreased to his baseline of 10.6 in April 2017 has been for a number of years. We'll have him follow-up in 6 months with PSA prior.  2. BPH with LUTS- Patient has massive BPH, but his IPSS score 4/1.Continue Flomax and finasteride.  3. Hydroceles- Patient has bilaterally hydroceles which he is managing them with conservative measures. He also has a left inguinal hernia.  4. Left epididymitis: Resolved.  No Follow-up on file.  Nickie Retort,  MD  [facility] [address], [place], [place] [address] [phone]

## 2015-08-12 ENCOUNTER — Other Ambulatory Visit: Payer: Self-pay | Admitting: Urology

## 2015-08-15 NOTE — Telephone Encounter (Signed)
[facility] in [place] told the patient that the medication prescribed (finasterade) needs a prior authorization.

## 2015-08-16 ENCOUNTER — Other Ambulatory Visit: Payer: Self-pay

## 2015-08-16 DIAGNOSIS — N400 Enlarged prostate without lower urinary tract symptoms: Secondary | ICD-10-CM

## 2015-08-16 MED ORDER — FINASTERIDE 5 MG PO TABS: 5.0000 mg | ORAL_TABLET | Freq: Every day | ORAL | Status: DC

## 2015-11-28 ENCOUNTER — Other Ambulatory Visit: Payer: Medicare Other

## 2015-11-28 DIAGNOSIS — R972 Elevated prostate specific antigen [PSA]: Secondary | ICD-10-CM

## 2015-11-29 LAB — FPSA% REFLEX
% FREE PSA: 23.6 %
PSA, FREE: 1.98 ng/mL

## 2015-11-29 LAB — PSA TOTAL (REFLEX TO FREE): Prostate Specific Ag, Serum: 8.4 ng/mL — ABNORMAL HIGH (ref 0.0–4.0)

## 2015-12-01 ENCOUNTER — Encounter: Payer: Self-pay | Admitting: Urology

## 2015-12-01 ENCOUNTER — Ambulatory Visit (INDEPENDENT_AMBULATORY_CARE_PROVIDER_SITE_OTHER): Payer: Medicare Other | Admitting: Urology

## 2015-12-01 VITALS — BP 111/77 | HR 70 | Ht 68.0 in | Wt 148.4 lb

## 2015-12-01 DIAGNOSIS — R972 Elevated prostate specific antigen [PSA]: Secondary | ICD-10-CM | POA: Diagnosis not present

## 2015-12-01 DIAGNOSIS — N400 Enlarged prostate without lower urinary tract symptoms: Secondary | ICD-10-CM

## 2015-12-01 NOTE — Progress Notes (Signed)
12/01/2015 11:23 AM   Brendan Carter 1945-10-08 [id number]  Referring provider: No referring provider defined for this encounter.  Chief Complaint  Patient presents with  . Follow-up    elevated PSA    HPI: Patient is a 70 year old African American male with an elevated PSA, hydroceles and BPH with LUTS who presents for follow up.  Elevated PSA Patient's PSA was 20.6 ng/mL on 02/02/2015 and 16.2 on 02/06/15. His last PSA was 8.8 ng/mL 6 months ago. 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 It decreased to 10.7 ng/mL in April 2017. It is 8.4 in October 2017.  BPH WITH LUTS His IPSS score is 3/1. Nocturia x0. Mild frequency, urgency, and weak stream. He currently taking finasteride. He also denies any recent fevers, chills, nausea or vomiting. He does not have a family history of PCa.   11.3 ng/mL on 11/27/2011.   07.8 ng/mL on 08/06/2012  10.5 ng/mL on 02/05/2013  08.2 ng/mL on 08/06/2013  08.7 ng/mL on 02/05/2014 20.6 ng/mL on 02/04/2015 16.4 ng/mL on 02/06/2015             10.7 ng/mL on 05/25/2015   8.44 ng/mL on  11/28/2015      PMH: Past Medical History:  Diagnosis Date  . BPH (benign prostatic hyperplasia)   . Elevated PSA   . Hydrocele   . Hypertension   . Inguinal hernia    left  . Prostatitis   . Weak urinary stream     Surgical History: Past Surgical History:  Procedure Laterality Date  . HERNIA REPAIR Right   . hydrocelectomy Right 2013    Home Medications:    Medication List       Accurate as of 12/01/15 11:23 AM. Always use your most recent med list.          finasteride 5 MG tablet Commonly known as:  PROSCAR Take 1 tablet (5 mg total) by mouth daily.       Allergies: No Known Allergies  Family History: Family History  Problem Relation Age of Onset  . Kidney cancer Neg Hx   . Kidney disease Neg Hx   .  Prostate cancer Neg Hx     Social History:  reports that he has never smoked. He does not have any smokeless tobacco history on file. He reports that he does not drink alcohol or use drugs.  ROS: UROLOGY Frequent Urination?: No Hard to postpone urination?: No Burning/pain with urination?: No Get up at night to urinate?: No Leakage of urine?: No Urine stream starts and stops?: No Trouble starting stream?: No Do you have to strain to urinate?: No Blood in urine?: No Urinary tract infection?: No Sexually transmitted disease?: No Injury to kidneys or bladder?: No Painful intercourse?: No Weak stream?: No Erection problems?: No Penile pain?: No  Gastrointestinal Nausea?: No Vomiting?: No Indigestion/heartburn?: No Diarrhea?: No Constipation?: No  Constitutional Fever: No Night sweats?: No Weight loss?: No Fatigue?: No  Skin Skin rash/lesions?: No Itching?: No  Eyes Blurred vision?: No Double vision?: No  Ears/Nose/Throat Sore throat?: No Sinus problems?: No  Hematologic/Lymphatic Swollen glands?: No Easy bruising?: No  Cardiovascular Leg swelling?: No Chest pain?: No  Respiratory Cough?: No Shortness of breath?: No  Endocrine Excessive thirst?: No  Musculoskeletal Back pain?: No Joint pain?: No  Neurological Headaches?: No Dizziness?: No  Psychologic Depression?: No Anxiety?: No  Physical Exam: BP 111/77   Pulse 70   Ht 5'  8" (1.727 m)   Wt 148 lb 6.4 oz (67.3 kg)   BMI 22.56 kg/m   Constitutional:  Alert and oriented, No acute distress. HEENT: [place] AT, moist mucus membranes.  Trachea midline, no masses. Cardiovascular: No clubbing, cyanosis, or edema. Respiratory: Normal respiratory effort, no increased work of breathing. GI: Abdomen is soft, nontender, nondistended, no abdominal masses GU: No CVA tenderness. 100 g prostate. Smooth benign. No nodules. Skin: No rashes, bruises or suspicious lesions. Lymph: No cervical or inguinal  adenopathy. Neurologic: Grossly intact, no focal deficits, moving all 4 extremities. Psychiatric: Normal mood and affect.  Laboratory Data: Lab Results  Component Value Date   WBC 18.8 (H) 01/13/2015   HGB 12.0 (L) 01/13/2015   HCT 35.6 (L) 01/13/2015   MCV 100.6 (H) 01/13/2015   PLT 145 (L) 01/13/2015    Lab Results  Component Value Date   CREATININE 1.02 01/14/2015    No results found for: PSA  No results found for: TESTOSTERONE  No results found for: HGBA1C  Urinalysis    Component Value Date/Time   COLORURINE YELLOW (A) 01/12/2015 2145   APPEARANCEUR Clear 03/03/2015 1449   LABSPEC 1.023 01/12/2015 2145   PHURINE 6.0 01/12/2015 2145   GLUCOSEU Negative 03/03/2015 1449   HGBUR 2+ (A) 01/12/2015 2145   BILIRUBINUR Negative 03/03/2015 1449   KETONESUR TRACE (A) 01/12/2015 2145   PROTEINUR Negative 03/03/2015 1449   PROTEINUR 100 (A) 01/12/2015 2145   NITRITE Negative 03/03/2015 1449   NITRITE POSITIVE (A) 01/12/2015 2145   LEUKOCYTESUR Negative 03/03/2015 1449     Assessment & Plan:    1. Elevated PSA- Patient has had a h/o elevated PSA for the last 10 yrs. His PSA's have been in the 8 to 10 range, but recently it has risen to 20.6 ng/mL on 02/02/2015 and 16.2 on 02/06/15. It is currently 8.4. We'll have him follow-up in 6 months with PSA prior.  2. BPH with LUTS- Patient has massive BPH, but his IPSS score 4/1.Continue finasteride.   Return in about 6 months (around 05/31/2016) for with PSA prior.  Nickie Retort, MD  [facility] [address], [place], [place] [address] [phone]

## 2016-05-11 ENCOUNTER — Other Ambulatory Visit: Payer: Self-pay | Admitting: Urology

## 2016-05-11 DIAGNOSIS — N400 Enlarged prostate without lower urinary tract symptoms: Secondary | ICD-10-CM

## 2016-05-23 ENCOUNTER — Other Ambulatory Visit: Payer: Medicare Other

## 2016-05-23 DIAGNOSIS — R972 Elevated prostate specific antigen [PSA]: Secondary | ICD-10-CM | POA: Diagnosis not present

## 2016-05-23 DIAGNOSIS — N400 Enlarged prostate without lower urinary tract symptoms: Secondary | ICD-10-CM | POA: Diagnosis not present

## 2016-05-24 ENCOUNTER — Other Ambulatory Visit: Payer: Medicare Other

## 2016-05-24 LAB — PSA: Prostate Specific Ag, Serum: 8.1 ng/mL — ABNORMAL HIGH (ref 0.0–4.0)

## 2016-05-31 ENCOUNTER — Ambulatory Visit: Payer: Medicare Other

## 2016-06-06 ENCOUNTER — Ambulatory Visit (INDEPENDENT_AMBULATORY_CARE_PROVIDER_SITE_OTHER): Payer: Medicare Other | Admitting: Urology

## 2016-06-06 ENCOUNTER — Encounter: Payer: Self-pay | Admitting: Urology

## 2016-06-06 VITALS — BP 104/67 | HR 79 | Ht 68.0 in | Wt 150.6 lb

## 2016-06-06 DIAGNOSIS — N400 Enlarged prostate without lower urinary tract symptoms: Secondary | ICD-10-CM | POA: Diagnosis not present

## 2016-06-06 DIAGNOSIS — R972 Elevated prostate specific antigen [PSA]: Secondary | ICD-10-CM

## 2016-06-06 NOTE — Progress Notes (Signed)
06/06/2016 1:43 PM   Brendan Carter [birth year] [id number]  Referring provider: No referring provider defined for this encounter.  Chief Complaint  Patient presents with  . Elevated PSA    HPI: Patient is a 71 year old African American male with an elevated PSA, hydroceles and BPH with LUTS who presents for follow up.  Elevated PSA Patient's PSA was 20.6 ng/mL on 02/02/2015 and 16.2 on 02/06/15. His last PSA was 8.8 ng/mL 6 months ago. He has massive BPH (DRE 100 gm smooth - 11/2015) and he has been on finasteride. It decreased to 10.7 ng/mL in April 2017. It is 8.4 in October 2017. It feel to 8.1 in April 2018. He does not have a family history of PCa.    BPH WITH LUTS His IPSS score is 0/1. Nocturia x0. He currently taking finasteride. He also denies any recent fevers, chills, nausea or vomiting.  11.3 ng/mL on 11/27/2011.   07.8 ng/mL on 08/06/2012  10.5 ng/mL on 02/05/2013  08.2 ng/mL on 08/06/2013  08.7 ng/mL on 02/05/2014 20.6 ng/mL on 02/04/2015 16.4 ng/mL on 02/06/2015 10.7 ng/mL on 05/25/2015              8.44 ng/mL on 11/28/2015  8.1   ng/mL on 05/23/2016   PMH: Past Medical History:  Diagnosis Date  . BPH (benign prostatic hyperplasia)   . Elevated PSA   . Hydrocele   . Hypertension   . Inguinal hernia    left  . Prostatitis   . Weak urinary stream     Surgical History: Past Surgical History:  Procedure Laterality Date  . HERNIA REPAIR Right   . hydrocelectomy Right 2013    Home Medications:  Allergies as of 06/06/2016   No Known Allergies     Medication List       Accurate as of 06/06/16  1:43 PM. Always use your most recent med list.          finasteride 5 MG tablet Commonly known as:  PROSCAR TAKE ONE TABLET BY MOUTH ONCE DAILY       Allergies: No Known Allergies  Family History: Family History  Problem Relation Age of  Onset  . Kidney cancer Neg Hx   . Kidney disease Neg Hx   . Prostate cancer Neg Hx     Social History:  reports that he has never smoked. He has never used smokeless tobacco. He reports that he does not drink alcohol or use drugs.  ROS: UROLOGY Frequent Urination?: No Hard to postpone urination?: No Burning/pain with urination?: No Get up at night to urinate?: No Leakage of urine?: No Urine stream starts and stops?: No Trouble starting stream?: No Do you have to strain to urinate?: No Blood in urine?: No Urinary tract infection?: No Sexually transmitted disease?: No Injury to kidneys or bladder?: No Painful intercourse?: No Weak stream?: No Erection problems?: No Penile pain?: No  Gastrointestinal Nausea?: No Vomiting?: No Indigestion/heartburn?: No Diarrhea?: No Constipation?: No  Constitutional Fever: No Night sweats?: No Weight loss?: No Fatigue?: No  Skin Skin rash/lesions?: No Itching?: No  Eyes Blurred vision?: No Double vision?: No  Ears/Nose/Throat Sore throat?: No Sinus problems?: No  Hematologic/Lymphatic Swollen glands?: No Easy bruising?: No  Cardiovascular Leg swelling?: No Chest pain?: No  Respiratory Cough?: No Shortness of breath?: No  Endocrine Excessive thirst?: No  Musculoskeletal Back pain?: No Joint pain?: No  Neurological Headaches?: No Dizziness?: No  Psychologic Depression?: No Anxiety?: No  Physical Exam: BP 104/67 (  BP Location: Left Arm, Patient Position: Sitting, Cuff Size: Normal)   Pulse 79   Ht 5\' 8"  (1.727 m)   Wt 150 lb 9.6 oz (68.3 kg)   BMI 22.90 kg/m   Constitutional:  Alert and oriented, No acute distress. HEENT: [place] AT, moist mucus membranes.  Trachea midline, no masses. Cardiovascular: No clubbing, cyanosis, or edema. Respiratory: Normal respiratory effort, no increased work of breathing. GI: Abdomen is soft, nontender, nondistended, no abdominal masses GU: No CVA tenderness.  Skin: No  rashes, bruises or suspicious lesions. Lymph: No cervical or inguinal adenopathy. Neurologic: Grossly intact, no focal deficits, moving all 4 extremities. Psychiatric: Normal mood and affect.  Laboratory Data: Lab Results  Component Value Date   WBC 18.8 (H) 01/13/2015   HGB 12.0 (L) 01/13/2015   HCT 35.6 (L) 01/13/2015   MCV 100.6 (H) 01/13/2015   PLT 145 (L) 01/13/2015    Lab Results  Component Value Date   CREATININE 1.02 01/14/2015    No results found for: PSA  No results found for: TESTOSTERONE  No results found for: HGBA1C  Urinalysis    Component Value Date/Time   COLORURINE YELLOW (A) 01/12/2015 2145   APPEARANCEUR Clear 03/03/2015 1449   LABSPEC 1.023 01/12/2015 2145   PHURINE 6.0 01/12/2015 2145   GLUCOSEU Negative 03/03/2015 1449   HGBUR 2+ (A) 01/12/2015 2145   BILIRUBINUR Negative 03/03/2015 1449   KETONESUR TRACE (A) 01/12/2015 2145   PROTEINUR Negative 03/03/2015 1449   PROTEINUR 100 (A) 01/12/2015 2145   NITRITE Negative 03/03/2015 1449   NITRITE POSITIVE (A) 01/12/2015 2145   LEUKOCYTESUR Negative 03/03/2015 1449     Assessment & Plan:    1. Elevated PSA- Patient has had a h/o elevated PSA for the last 10 yrs. His PSA's have been in the 8 to 10 range, but recently it has risen to 20.6 ng/mL on 02/02/2015 and 16.2 on 02/06/15. It is currently 8.1. Due to persistent stability, It would be reasonable to recheck his PSA in one year at this point. However the patient has requested to continue every 6 months for now. He is agreeable to increasing the interval if it continues to decrease. We will have him follow-up in 6 months with a PSA prior.  2. BPH with LUTS- Patient has massive BPH, but his IPSS score 0/1.Continue finasteride.  Return in about 6 months (around 12/06/2016) for PSA prior.  Nickie Retort, MD  [facility] [address], [place],  [address] [phone]

## 2016-08-10 ENCOUNTER — Other Ambulatory Visit: Payer: Self-pay | Admitting: Urology

## 2016-08-10 DIAGNOSIS — N400 Enlarged prostate without lower urinary tract symptoms: Secondary | ICD-10-CM

## 2016-08-10 NOTE — Telephone Encounter (Signed)
Pt is out of finasteride and return appt is in October.  Please give pt a call [phone]

## 2016-08-10 NOTE — Telephone Encounter (Signed)
Called pt and advised Rx sent to pharmacy on file. Pt verbalized understanding.

## 2016-11-10 ENCOUNTER — Other Ambulatory Visit: Payer: Self-pay | Admitting: Urology

## 2016-11-10 DIAGNOSIS — N400 Enlarged prostate without lower urinary tract symptoms: Secondary | ICD-10-CM

## 2016-12-03 ENCOUNTER — Other Ambulatory Visit: Payer: Medicare Other

## 2016-12-03 DIAGNOSIS — N400 Enlarged prostate without lower urinary tract symptoms: Secondary | ICD-10-CM | POA: Diagnosis not present

## 2016-12-03 DIAGNOSIS — R972 Elevated prostate specific antigen [PSA]: Secondary | ICD-10-CM

## 2016-12-04 LAB — PSA: Prostate Specific Ag, Serum: 8.5 ng/mL — ABNORMAL HIGH (ref 0.0–4.0)

## 2016-12-06 ENCOUNTER — Encounter: Payer: Self-pay | Admitting: Urology

## 2016-12-06 ENCOUNTER — Ambulatory Visit (INDEPENDENT_AMBULATORY_CARE_PROVIDER_SITE_OTHER): Payer: Medicare Other | Admitting: Urology

## 2016-12-06 VITALS — BP 138/82 | HR 65 | Ht 68.0 in | Wt 146.7 lb

## 2016-12-06 DIAGNOSIS — R972 Elevated prostate specific antigen [PSA]: Secondary | ICD-10-CM

## 2016-12-06 DIAGNOSIS — N400 Enlarged prostate without lower urinary tract symptoms: Secondary | ICD-10-CM | POA: Diagnosis not present

## 2016-12-06 NOTE — Progress Notes (Signed)
12/06/2016 9:53 AM   Brendan Carter 09-Oct-1945 [id number]  Referring provider: No referring provider defined for this encounter.  Chief Complaint  Patient presents with  . Elevated PSA    HPI: Patient is a 71year old [place] American male with an elevated PSA, hydroceles and BPH with LUTS who presents for follow up.  Elevated PSA Patient's PSA was 20.6 ng/mL on 02/02/2015 and 16.2 on 02/06/15. His last PSA was 8.8 ng/mL 6 months ago. He has massive BPH (DRE 100 gm smooth - 11/2015) and he has been on finasteride. It decreased to 10.7 ng/mL in April 2017. It is 8.4 in October 2017. It feel to 8.1 in April 2018. He does not have a family history of PCa.    BPH WITH LUTS His IPSS score is 0/1. Nocturia x0.He currently taking finasteride. He also denies any recent fevers, chills, nausea or vomiting.   11.3 ng/mL on 11/27/2011.   07.8 ng/mL on 08/06/2012  10.5 ng/mL on 02/05/2013  08.2 ng/mL on 08/06/2013  08.7 ng/mL on 02/05/2014 20.6 ng/mL on 02/04/2015 16.4 ng/mL on 02/06/2015 10.7 ng/mL on 05/25/2015  8.44 ng/mL on 11/28/2015             8.1   ng/mL on 05/23/2016  8.5   Ng/mL on 12/03/2016      PMH: Past Medical History:  Diagnosis Date  . BPH (benign prostatic hyperplasia)   . Elevated PSA   . Hydrocele   . Hypertension   . Inguinal hernia    left  . Prostatitis   . Weak urinary stream     Surgical History: Past Surgical History:  Procedure Laterality Date  . HERNIA REPAIR Right   . hydrocelectomy Right 2013    Home Medications:  Allergies as of 12/06/2016   No Known Allergies     Medication List       Accurate as of 12/06/16  9:53 AM. Always use your most recent med list.          finasteride 5 MG tablet Commonly known as:  PROSCAR TAKE 1 TABLET BY MOUTH ONCE DAILY       Allergies: No Known Allergies  Family  History: Family History  Problem Relation Age of Onset  . Kidney cancer Neg Hx   . Kidney disease Neg Hx   . Prostate cancer Neg Hx     Social History:  reports that he has never smoked. He has never used smokeless tobacco. He reports that he does not drink alcohol or use drugs.  ROS: UROLOGY Frequent Urination?: No Hard to postpone urination?: No Burning/pain with urination?: No Get up at night to urinate?: No Leakage of urine?: No Urine stream starts and stops?: No Trouble starting stream?: No Do you have to strain to urinate?: No Blood in urine?: No Urinary tract infection?: No Sexually transmitted disease?: No Injury to kidneys or bladder?: No Painful intercourse?: No Weak stream?: No Erection problems?: No Penile pain?: No  Gastrointestinal Nausea?: No Vomiting?: No Indigestion/heartburn?: No Diarrhea?: No Constipation?: No  Constitutional Fever: No Night sweats?: No Weight loss?: No Fatigue?: No  Skin Skin rash/lesions?: No Itching?: No  Eyes Blurred vision?: No Double vision?: No  Ears/Nose/Throat Sore throat?: No Sinus problems?: No  Hematologic/Lymphatic Swollen glands?: No Easy bruising?: No  Cardiovascular Leg swelling?: No Chest pain?: No  Respiratory Cough?: No Shortness of breath?: No  Endocrine Excessive thirst?: No  Musculoskeletal Back pain?: No Joint pain?: No  Neurological Headaches?: No Dizziness?: No  Psychologic Depression?:  No Anxiety?: No  Physical Exam: BP 138/82 (BP Location: Right Arm, Patient Position: Sitting, Cuff Size: Normal)   Pulse 65   Ht 5\' 8"  (1.727 m)   Wt 146 lb 11.2 oz (66.5 kg)   BMI 22.31 kg/m   Constitutional:  Alert and oriented, No acute distress. HEENT: [place] AT, moist mucus membranes.  Trachea midline, no masses. Cardiovascular: No clubbing, cyanosis, or edema. Respiratory: Normal respiratory effort, no increased work of breathing. GI: Abdomen is soft, nontender, nondistended, no  abdominal masses GU: No CVA tenderness. DRE: 100 g and benign Skin: No rashes, bruises or suspicious lesions. Lymph: No cervical or inguinal adenopathy. Neurologic: Grossly intact, no focal deficits, moving all 4 extremities. Psychiatric: Normal mood and affect.  Laboratory Data: Lab Results  Component Value Date   WBC 18.8 (H) 01/13/2015   HGB 12.0 (L) 01/13/2015   HCT 35.6 (L) 01/13/2015   MCV 100.6 (H) 01/13/2015   PLT 145 (L) 01/13/2015    Lab Results  Component Value Date   CREATININE 1.02 01/14/2015    No results found for: PSA  No results found for: TESTOSTERONE  No results found for: HGBA1C  Urinalysis    Component Value Date/Time   COLORURINE YELLOW (A) 01/12/2015 2145   APPEARANCEUR Clear 03/03/2015 1449   LABSPEC 1.023 01/12/2015 2145   PHURINE 6.0 01/12/2015 2145   GLUCOSEU Negative 03/03/2015 1449   HGBUR 2+ (A) 01/12/2015 2145   BILIRUBINUR Negative 03/03/2015 1449   KETONESUR TRACE (A) 01/12/2015 2145   PROTEINUR Negative 03/03/2015 1449   PROTEINUR 100 (A) 01/12/2015 2145   NITRITE Negative 03/03/2015 1449   NITRITE POSITIVE (A) 01/12/2015 2145   LEUKOCYTESUR Negative 03/03/2015 1449     Assessment & Plan:    1. Elevated PSA- Patient has had a h/o elevated PSA for the last 10 yrs. His PSA's have been in the 8 to 10 range, but recently it has risen to 20.6 ng/mL on 02/02/2015 and 16.2 on 02/06/15. It is currently 8.5. Will plan to follow up in 6 months with PSA prior  2. BPH with LUTS- Patient has massive BPH, but his IPSS score 0/1.Continue finasteride.  Return in about 6 months (around 06/06/2017) for PSA prior.  Nickie Retort, MD  [facility] [address] [place], [place] [address] [phone]

## 2017-02-09 ENCOUNTER — Other Ambulatory Visit: Payer: Self-pay | Admitting: Urology

## 2017-02-09 DIAGNOSIS — N400 Enlarged prostate without lower urinary tract symptoms: Secondary | ICD-10-CM

## 2017-05-10 ENCOUNTER — Other Ambulatory Visit: Payer: Self-pay | Admitting: Urology

## 2017-05-10 DIAGNOSIS — N400 Enlarged prostate without lower urinary tract symptoms: Secondary | ICD-10-CM

## 2017-06-05 ENCOUNTER — Other Ambulatory Visit: Payer: Medicare Other

## 2017-06-05 DIAGNOSIS — R972 Elevated prostate specific antigen [PSA]: Secondary | ICD-10-CM

## 2017-06-06 LAB — PSA: Prostate Specific Ag, Serum: 7.3 ng/mL — ABNORMAL HIGH (ref 0.0–4.0)

## 2017-06-07 ENCOUNTER — Ambulatory Visit: Payer: Medicare Other

## 2017-06-13 ENCOUNTER — Encounter: Payer: Self-pay | Admitting: Urology

## 2017-06-13 ENCOUNTER — Ambulatory Visit (INDEPENDENT_AMBULATORY_CARE_PROVIDER_SITE_OTHER): Payer: Medicare Other | Admitting: Urology

## 2017-06-13 VITALS — BP 118/82 | HR 70 | Ht 68.0 in | Wt 150.5 lb

## 2017-06-13 DIAGNOSIS — R972 Elevated prostate specific antigen [PSA]: Secondary | ICD-10-CM | POA: Diagnosis not present

## 2017-06-13 DIAGNOSIS — N400 Enlarged prostate without lower urinary tract symptoms: Secondary | ICD-10-CM | POA: Diagnosis not present

## 2017-06-13 MED ORDER — FINASTERIDE 5 MG PO TABS: 5.0000 mg | ORAL_TABLET | Freq: Every day | ORAL | 4 refills | Status: DC

## 2017-06-13 NOTE — Progress Notes (Signed)
06/13/2017 1:07 PM   Brendan Carter Oct 11, 1945 [id number]  Referring provider: No referring provider defined for this encounter.  Chief Complaint  Patient presents with  . Elevated PSA    HPI: 72 year old male with a history of massive BPH and elevated PSAs he returns today for routine six-month follow-up.   Elevated PSA Personal history of stably elevated PSA to 20.6 now down to 7.6 currently on finasteride.  Has a history of massive BPH on rectal exam.  No previous prostate biopsies.  11.3 ng/mL on 11/27/2011.   07.8 ng/mL on 08/06/2012  10.5 ng/mL on 02/05/2013  08.2 ng/mL on 08/06/2013  08.7 ng/mL on 02/05/2014 20.6 ng/mL on 02/04/2015 16.4 ng/mL on 02/06/2015 10.7 ng/mL on 05/25/2015  8.44 ng/mL on 11/28/2015 8.1 ng/mL on 05/23/2016             8.5   Ng/mL on 12/03/2016             7.6  Ng/nL on 06/05/2017   BPH WITH LUTS He currently taking finasteride. He also denies any recent fevers, chills, nausea or vomiting.He feels that he is emptying his bladder adequately with a normal stream.  He is completely asymptomatic.    PMH: Past Medical History:  Diagnosis Date  . BPH (benign prostatic hyperplasia)   . Elevated PSA   . Hydrocele   . Hypertension   . Inguinal hernia    left  . Prostatitis   . Weak urinary stream     Surgical History: Past Surgical History:  Procedure Laterality Date  . HERNIA REPAIR Right   . hydrocelectomy Right 2013    Home Medications:  Allergies as of 06/13/2017   No Known Allergies     Medication List        Accurate as of 06/13/17 11:59 PM. Always use your most recent med list.          finasteride 5 MG tablet Commonly known as:  PROSCAR Take 1 tablet (5 mg total) by mouth daily.       Allergies: No Known Allergies  Family History: Family History  Problem Relation Age of Onset  .  Kidney cancer Neg Hx   . Kidney disease Neg Hx   . Prostate cancer Neg Hx     Social History:  reports that he has never smoked. He has never used smokeless tobacco. He reports that he does not drink alcohol or use drugs.  ROS: UROLOGY Frequent Urination?: No Hard to postpone urination?: No Burning/pain with urination?: No Get up at night to urinate?: No Leakage of urine?: No Urine stream starts and stops?: No Trouble starting stream?: No Do you have to strain to urinate?: No Blood in urine?: No Urinary tract infection?: No Sexually transmitted disease?: No Injury to kidneys or bladder?: No Painful intercourse?: No Weak stream?: No Erection problems?: No Penile pain?: No  Gastrointestinal Nausea?: No Vomiting?: No Indigestion/heartburn?: No Diarrhea?: No Constipation?: No  Constitutional Fever: No Night sweats?: No Weight loss?: No Fatigue?: No  Skin Skin rash/lesions?: No Itching?: No  Eyes Blurred vision?: No Double vision?: No  Ears/Nose/Throat Sore throat?: No Sinus problems?: No  Hematologic/Lymphatic Swollen glands?: No Easy bruising?: No  Cardiovascular Leg swelling?: No Chest pain?: No  Respiratory Cough?: No Shortness of breath?: No  Endocrine Excessive thirst?: No  Musculoskeletal Back pain?: No Joint pain?: No  Neurological Headaches?: No Dizziness?: No  Psychologic Depression?: No Anxiety?: No  Physical Exam: BP 118/82 (BP Location: Right Arm, Patient Position: Sitting, Cuff  Size: Normal)   Pulse 70   Ht 5\' 8"  (1.727 m)   Wt 150 lb 8 oz (68.3 kg)   BMI 22.88 kg/m   Constitutional:  Alert and oriented, No acute distress. HEENT: [place] AT, moist mucus membranes.  Trachea midline, no masses. Cardiovascular: No clubbing, cyanosis, or edema. Respiratory: Normal respiratory effort, no increased work of breathing. Rectal: Deferred today, last performed on 11/2016 by Dr. Pilar Jarvis, 100 g benign Skin: No rashes, bruises or  suspicious lesions. Neurologic: Grossly intact, no focal deficits, moving all 4 extremities. Psychiatric: Normal mood and affect.  Laboratory Data: Lab Results  Component Value Date   WBC 18.8 (H) 01/13/2015   HGB 12.0 (L) 01/13/2015   HCT 35.6 (L) 01/13/2015   MCV 100.6 (H) 01/13/2015   PLT 145 (L) 01/13/2015    Lab Results  Component Value Date   CREATININE 1.02 01/14/2015   PSA trend as above  Urinalysis Not applicable  Pertinent Imaging: No new recent imaging for review  Assessment & Plan:    1. Elevated PSA- Patient has had a h/o elevated PSA for the last 10 yrs. His PSA's have been in the 8 to 10 range PSA is stable and reassuring We will continue to follow annual basis, consider cessation of screening around age 60  2. BPH with LUTS-personal history of massive BPH Currently asymptomatic on finasteride Continue this medication   Return in about 1 year (around 06/14/2018) for PSA/ DRE/ IPSS.  Hollice Espy, MD  [facility] [address], [place], [place] [address] [phone]

## 2017-06-14 ENCOUNTER — Ambulatory Visit: Payer: Medicare Other

## 2018-06-09 ENCOUNTER — Telehealth: Payer: Self-pay | Admitting: Urology

## 2018-06-09 DIAGNOSIS — N400 Enlarged prostate without lower urinary tract symptoms: Secondary | ICD-10-CM

## 2018-06-09 MED ORDER — FINASTERIDE 5 MG PO TABS: 5.0000 mg | ORAL_TABLET | Freq: Every day | ORAL | 0 refills | Status: DC

## 2018-06-09 NOTE — Addendum Note (Signed)
Addended by: Feliberto Gottron on: 06/09/2018 12:40 PM   Modules accepted: Orders

## 2018-06-09 NOTE — Telephone Encounter (Signed)
Finasteride Rx sent to pharmacy

## 2018-06-09 NOTE — Telephone Encounter (Signed)
Patient's app was pushed out til August but he will need a refill on his Finasteride called into his pharmacy please.   Sharyn Lull

## 2018-06-16 ENCOUNTER — Other Ambulatory Visit: Payer: Medicare Other

## 2018-06-18 ENCOUNTER — Ambulatory Visit: Payer: Medicare Other | Admitting: Urology

## 2018-09-12 ENCOUNTER — Other Ambulatory Visit: Payer: Self-pay | Admitting: Urology

## 2018-09-12 DIAGNOSIS — N400 Enlarged prostate without lower urinary tract symptoms: Secondary | ICD-10-CM

## 2018-09-15 ENCOUNTER — Other Ambulatory Visit: Payer: Self-pay | Admitting: Family Medicine

## 2018-09-15 DIAGNOSIS — N400 Enlarged prostate without lower urinary tract symptoms: Secondary | ICD-10-CM

## 2018-09-15 MED ORDER — FINASTERIDE 5 MG PO TABS: 5.0000 mg | ORAL_TABLET | Freq: Every day | ORAL | 3 refills | Status: DC

## 2018-09-22 ENCOUNTER — Other Ambulatory Visit: Payer: Self-pay

## 2018-09-22 DIAGNOSIS — R972 Elevated prostate specific antigen [PSA]: Secondary | ICD-10-CM

## 2018-09-23 ENCOUNTER — Other Ambulatory Visit: Payer: Self-pay

## 2018-09-23 ENCOUNTER — Other Ambulatory Visit: Payer: Medicare Other

## 2018-09-23 DIAGNOSIS — R972 Elevated prostate specific antigen [PSA]: Secondary | ICD-10-CM

## 2018-09-24 LAB — PSA: Prostate Specific Ag, Serum: 10 ng/mL — ABNORMAL HIGH (ref 0.0–4.0)

## 2018-10-08 ENCOUNTER — Ambulatory Visit (INDEPENDENT_AMBULATORY_CARE_PROVIDER_SITE_OTHER): Payer: Medicare Other | Admitting: Urology

## 2018-10-08 ENCOUNTER — Other Ambulatory Visit: Payer: Self-pay

## 2018-10-08 ENCOUNTER — Encounter: Payer: Self-pay | Admitting: Urology

## 2018-10-08 VITALS — BP 122/79 | HR 74 | Ht 68.0 in | Wt 149.0 lb

## 2018-10-08 DIAGNOSIS — R972 Elevated prostate specific antigen [PSA]: Secondary | ICD-10-CM

## 2018-10-08 DIAGNOSIS — N401 Enlarged prostate with lower urinary tract symptoms: Secondary | ICD-10-CM | POA: Diagnosis not present

## 2018-10-08 DIAGNOSIS — R3912 Poor urinary stream: Secondary | ICD-10-CM

## 2018-10-08 NOTE — Progress Notes (Signed)
10/08/2018 10:05 AM   [name] 1946/02/12 [id number]  Referring provider: No referring provider defined for this encounter.  Chief Complaint  Patient presents with  . Elevated PSA    follow up    HPI: 73 year old male with a personal history of elevated PSA/massive BPH who presents today for routine annual follow-up.  His last visit was 05/2017.  Elevated PSA Personal history elevated PSA with Has a history of massive BPH on rectal exam.  No previous prostate biopsies.  11.3 ng/mL on 11/27/2011.   07.8 ng/mL on 08/06/2012  10.5 ng/mL on 02/05/2013  08.2 ng/mL on 08/06/2013  08.7 ng/mL on 02/05/2014 20.6 ng/mL on 02/04/2015 16.4 ng/mL on 02/06/2015 10.7 ng/mL on 05/25/2015  8.44 ng/mL on 11/28/2015 8.1 ng/mL on 05/23/2016 8.5 Ng/mL on 12/03/2016             7.6  ng/mL on 06/05/2017             10.0 ng/mL on 09/23/2018   BPH WITH LUTS He does have a history of massive BPH with prostamegaly, history of UTIs remotely.  He is currently on chronic finasteride.  Today, he reports the symptoms are minimal.  He goes to the bathroom before he leaves the house to ensure that he will not have any issues while out.  He does not get up at night to void.  He feels like his stream is definitely weaker than it used to be but is not bothersome to him.  He feels like he is emptying his bladder all the way.  No UTIs, dysuria, or hematuria.   IPSS    Row Name 10/08/18 0900         International Prostate Symptom Score   How often have you had the sensation of not emptying your bladder?  Less than 1 in 5     How often have you had to urinate less than every two hours?  Less than half the time     How often have you found you stopped and started again several times when you urinated?  Less than half the time     How often have you found it  difficult to postpone urination?  About half the time     How often have you had a weak urinary stream?  Less than half the time     How often have you had to strain to start urination?  Less than half the time     How many times did you typically get up at night to urinate?  2 Times     Total IPSS Score  14       Quality of Life due to urinary symptoms   If you were to spend the rest of your life with your urinary condition just the way it is now how would you feel about that?  Mixed        Score:  1-7 Mild 8-19 Moderate 20-35 Severe     PMH: Past Medical History:  Diagnosis Date  . BPH (benign prostatic hyperplasia)   . Elevated PSA   . Hydrocele   . Hypertension   . Inguinal hernia    left  . Prostatitis   . Weak urinary stream     Surgical History: Past Surgical History:  Procedure Laterality Date  . HERNIA REPAIR Right   . hydrocelectomy Right 2013    Home Medications:  Allergies as of 10/08/2018   No Known Allergies     Medication  List       Accurate as of October 08, 2018 10:05 AM. If you have any questions, ask your nurse or doctor.        finasteride 5 MG tablet Commonly known as: PROSCAR Take 1 tablet (5 mg total) by mouth daily.       Allergies: No Known Allergies  Family History: Family History  Problem Relation Age of Onset  . Kidney cancer Neg Hx   . Kidney disease Neg Hx   . Prostate cancer Neg Hx     Social History:  reports that he has never smoked. He has never used smokeless tobacco. He reports that he does not drink alcohol or use drugs.  ROS: UROLOGY Frequent Urination?: No Hard to postpone urination?: No Burning/pain with urination?: No Get up at night to urinate?: No Leakage of urine?: No Urine stream starts and stops?: No Trouble starting stream?: No Do you have to strain to urinate?: No Blood in urine?: No Urinary tract infection?: No Sexually transmitted disease?: No Injury to kidneys or bladder?: No Painful  intercourse?: No Weak stream?: No Erection problems?: No Penile pain?: No  Gastrointestinal Nausea?: No Vomiting?: No Indigestion/heartburn?: No Diarrhea?: No Constipation?: No  Constitutional Fever: No Night sweats?: No Weight loss?: No Fatigue?: No  Skin Skin rash/lesions?: No Itching?: No  Eyes Blurred vision?: No Double vision?: No  Ears/Nose/Throat Sore throat?: No Sinus problems?: No  Hematologic/Lymphatic Swollen glands?: No Easy bruising?: No  Cardiovascular Leg swelling?: No Chest pain?: No  Respiratory Cough?: No Shortness of breath?: No  Endocrine Excessive thirst?: No  Musculoskeletal Back pain?: No Joint pain?: No  Neurological Headaches?: No Dizziness?: No  Psychologic Depression?: No Anxiety?: No  Physical Exam: BP 122/79 (BP Location: Left Arm, Patient Position: Sitting)   Pulse 74   Ht 5\' 8"  (1.727 m)   Wt 149 lb (67.6 kg)   BMI 22.66 kg/m   Constitutional:  Alert and oriented, No acute distress. HEENT: [place] AT, moist mucus membranes.  Trachea midline, no masses. Cardiovascular: No clubbing, cyanosis, or edema. Respiratory: Normal respiratory effort, no increased work of breathing. GI: Abdomen is soft, nontender, nondistended, no abdominal masses Rectal: Normal sphincter tone.  Massive prostate, 60+ cc, nontender, no nodules.. Skin: No rashes, bruises or suspicious lesions. Neurologic: Grossly intact, no focal deficits, moving all 4 extremities. Psychiatric: Normal mood and affect.  Laboratory Data: PSA as above  Assessment & Plan:    1. Elevated PSA Personal history of markedly elevated PSA in the setting of massive prostamegaly His overall PSA is down from his peak of 20 on finasteride He has had a rise from a year ago Given his history of massive BPH and PSA fluctuation, I recommended he follow-up in 6 months with a PSA prior, if his PSA continues to be upward trending, would consider prostate MR is at this point the  suspicion for prostate cancer still relatively low He is agreeable this plan  2. Benign prostatic hyperplasia with weak urinary stream Continue finasteride Not interested in surgical intervention although would be a good candidate for holep We will continue to reassess annually    Return in about 6 months (around 04/10/2019) for PSA.  Hollice Espy, MD  [facility] [address] [place], [place] [address] [phone]

## 2019-04-07 ENCOUNTER — Other Ambulatory Visit: Payer: Self-pay

## 2019-04-07 ENCOUNTER — Other Ambulatory Visit: Payer: Medicare Other

## 2019-04-07 DIAGNOSIS — R972 Elevated prostate specific antigen [PSA]: Secondary | ICD-10-CM

## 2019-04-07 DIAGNOSIS — N401 Enlarged prostate with lower urinary tract symptoms: Secondary | ICD-10-CM

## 2019-04-07 DIAGNOSIS — R3912 Poor urinary stream: Secondary | ICD-10-CM

## 2019-04-08 LAB — PSA: Prostate Specific Ag, Serum: 13.4 ng/mL — ABNORMAL HIGH (ref 0.0–4.0)

## 2019-04-14 ENCOUNTER — Other Ambulatory Visit: Payer: Self-pay

## 2019-04-14 ENCOUNTER — Ambulatory Visit (INDEPENDENT_AMBULATORY_CARE_PROVIDER_SITE_OTHER): Payer: Medicare Other | Admitting: Urology

## 2019-04-14 ENCOUNTER — Encounter: Payer: Self-pay | Admitting: Urology

## 2019-04-14 VITALS — BP 144/76 | HR 79 | Ht 68.0 in | Wt 159.0 lb

## 2019-04-14 DIAGNOSIS — R972 Elevated prostate specific antigen [PSA]: Secondary | ICD-10-CM

## 2019-04-14 DIAGNOSIS — N138 Other obstructive and reflux uropathy: Secondary | ICD-10-CM

## 2019-04-14 DIAGNOSIS — N401 Enlarged prostate with lower urinary tract symptoms: Secondary | ICD-10-CM

## 2019-04-14 NOTE — Progress Notes (Signed)
04/14/2019 12:52 PM   Brendan Carter 31-Oct-1945 [id number]  Referring provider: No referring provider defined for this encounter.  Chief Complaint  Patient presents with  . Elevated PSA    HPI: 74 year old male who presents today for history of elevated/rising PSA and BPH with massive protamegaly on chronic finasteride.  He was last seen in clinic 6 months ago.    Today, he feels like he is voiding well.  No new complaints.  He is not particularly conversive today.  No weight loss or bone pain.  Elevated PSA Personal history elevated PSA withHas a history of massive BPH on rectal exam. No previous prostate biopsies.             11.3 ng/mL on 11/27/2011.   07.8 ng/mL on 08/06/2012  10.5 ng/mL on 02/05/2013  08.2 ng/mL on 08/06/2013  08.7 ng/mL on 02/05/2014 20.6 ng/mL on 02/04/2015 16.4 ng/mL on 02/06/2015 10.7 ng/mL on 05/25/2015  8.44 ng/mL on 11/28/2015 8.1 ng/mL on 05/23/2016 8.5 Ng/mL on 12/03/2016 7.6 ng/mL on 06/05/2017             10.0 ng/mL on 09/23/2018               13.4 on 03/2019  BPH WITH LUTS He does have a history of massive BPH with prostamegaly, history of UTIs remotely.  He is currently on chronic finasteride.  Today, he reports the symptoms are minimal.  IPSS as below.    IPSS    Row Name 04/14/19 1000         International Prostate Symptom Score   How often have you had the sensation of not emptying your bladder?  Less than half the time     How often have you had to urinate less than every two hours?  Less than half the time     How often have you found you stopped and started again several times when you urinated?  Less than 1 in 5 times     How often have you found it difficult to postpone urination?  Less than half the time     How often have you had a weak urinary stream?  Not at All     How often have  you had to strain to start urination?  Not at All     How many times did you typically get up at night to urinate?  None     Total IPSS Score  7       Quality of Life due to urinary symptoms   If you were to spend the rest of your life with your urinary condition just the way it is now how would you feel about that?  Mixed                  Score:  1-7 Mild 8-19 Moderate 20-35 Severe   PMH: Past Medical History:  Diagnosis Date  . BPH (benign prostatic hyperplasia)   . Elevated PSA   . Hydrocele   . Hypertension   . Inguinal hernia    left  . Prostatitis   . Weak urinary stream     Surgical History: Past Surgical History:  Procedure Laterality Date  . HERNIA REPAIR Right   . hydrocelectomy Right 2013    Home Medications:  Allergies as of 04/14/2019   No Known Allergies     Medication List       Accurate as of April 14, 2019 12:52 PM. If you have any  questions, ask your nurse or doctor.        finasteride 5 MG tablet Commonly known as: PROSCAR Take 1 tablet (5 mg total) by mouth daily.       Allergies: No Known Allergies  Family History: Family History  Problem Relation Age of Onset  . Kidney cancer Neg Hx   . Kidney disease Neg Hx   . Prostate cancer Neg Hx     Social History:  reports that he has never smoked. He has never used smokeless tobacco. He reports that he does not drink alcohol or use drugs.   Physical Exam: BP (!) 144/76   Pulse 79   Ht 5\' 8"  (1.727 m)   Wt 159 lb (72.1 kg)   BMI 24.18 kg/m   Constitutional:  Alert and oriented, No acute distress. Flat affect.   HEENT: [place] AT, moist mucus membranes.  Trachea midline, no masses. Cardiovascular: No clubbing, cyanosis, or edema. Respiratory: Normal respiratory effort, no increased work of breathing. REctal: Nomral tone, 50+ cc prostate, nontender, no nodules Skin: No rashes, bruises or suspicious lesions. Neurologic: Grossly intact, no focal deficits, moving all 4  extremities. Psychiatric: Normal mood and affect.  Laboratory Data: PSA as above  Urinalysis    Component Value Date/Time   COLORURINE YELLOW (A) 01/12/2015 2145   APPEARANCEUR Clear 03/03/2015 1449   LABSPEC 1.023 01/12/2015 2145   PHURINE 6.0 01/12/2015 2145   GLUCOSEU Negative 03/03/2015 1449   HGBUR 2+ (A) 01/12/2015 2145   BILIRUBINUR Negative 03/03/2015 1449   KETONESUR TRACE (A) 01/12/2015 2145   PROTEINUR Negative 03/03/2015 1449   PROTEINUR 100 (A) 01/12/2015 2145   NITRITE Negative 03/03/2015 1449   NITRITE POSITIVE (A) 01/12/2015 2145   LEUKOCYTESUR Negative 03/03/2015 1449    Assessment & Plan:    1. Elevated PSA In the setting of massive prostamegaly and rising PSA, recommended further evaluation with prostate MR, he has never had 1 of these in the past  He is on multiple negative biopsies with significant prostamegaly and in the setting of rising PSA, concerned about possible sampling error  He is agreeable with above and will have him f/u as above for results  - MR PROSTATE W WO CONTRAST; Future  2. BPH with obstruction/lower urinary tract symptoms Chronically on finasteride-continue this medication  minimally symptomatic today   Return in about 4 weeks (around 05/12/2019) for f/u prostate MRI.  Hollice Espy, MD  [facility] [address], [place], [place] [address] [phone]

## 2019-05-11 ENCOUNTER — Other Ambulatory Visit: Payer: Self-pay

## 2019-05-11 ENCOUNTER — Ambulatory Visit
Admission: RE | Admit: 2019-05-11 | Discharge: 2019-05-11 | Disposition: A | Discharge status: Home / Self Care | Payer: Medicare Other | Source: Ambulatory Visit | Attending: Urology | Admitting: Urology

## 2019-05-11 DIAGNOSIS — R972 Elevated prostate specific antigen [PSA]: Secondary | ICD-10-CM | POA: Diagnosis not present

## 2019-05-11 LAB — POCT I-STAT CREATININE: Creatinine, Ser: 1.2 mg/dL (ref 0.61–1.24)

## 2019-05-11 MED ORDER — GADOBUTROL 1 MMOL/ML IV SOLN
7.0000 mL | Freq: Once | INTRAVENOUS | Status: AC | PRN
  Administered 2019-05-11: 7 mL via INTRAVENOUS

## 2019-05-12 NOTE — Progress Notes (Signed)
05/13/19 12:52 PM   [name] [birth year] [id number]  Referring provider: No referring provider defined for this encounter.  Chief Complaint  Patient presents with  . Results    HPI: Brendan Carter is a 74 y.o. African American M returns today for the evaluation and management of history of elevated/rising PSA and BPH w/ massive prostamegaly on chronic finasteride.   Elevated PSA Personal historyelevated PSA with a history of massive BPH on rectal exam.   He has had multiple negative biopsies with significant prostamegaly in the setting of rising PSA. Due to concerns about sampling error a prostate MRI was ordered on previous visit.   Pt today follows up with a prostate MRI from 05/11/19 indicating PI-RADS category 3 lesion of the right posterior transition zone near the apex. Prominent benign prostatic hypertrophy with prostate volume 290.99 cubic cm.   PSA trend:  11.3 ng/mL on 11/27/2011.  07.8 ng/mL on 08/06/2012 10.5 ng/mL on 02/05/2013 08.2 ng/mL on 08/06/2013 08.7 ng/mL on 02/05/2014 20.6 ng/mL on 02/04/2015 16.4 ng/mL on 02/06/2015 10.7 ng/mL on 05/25/2015  8.44 ng/mL on 11/28/2015 8.1 ng/mL on 05/23/2016 8.5 Ng/mL on 12/03/2016 7.6ng/mL on 06/05/2017 10.0 ng/mL on 09/23/2018 13.4 ng/mL on 04/07/2019  BPH WITH LUTS He does have a history of massive BPH with prostamegaly, history of UTIs remotely.   He is currently on chronic finasteride.   Today, he reports of no bothersome urinary symptoms.   He denies groin pain associated with hernia indicated on MRI.    PMH: Past Medical History:  Diagnosis Date  . BPH (benign prostatic hyperplasia)   . Elevated PSA   . Hydrocele   . Hypertension   . Inguinal hernia    left  . Prostatitis   . Weak urinary stream     Surgical History: Past Surgical History:  Procedure Laterality Date  . HERNIA REPAIR Right   . hydrocelectomy Right 2013    Home Medications:  Allergies as of 05/13/2019   No  Known Allergies     Medication List       Accurate as of May 13, 2019 12:52 PM. If you have any questions, ask your nurse or doctor.        finasteride 5 MG tablet Commonly known as: PROSCAR Take 1 tablet (5 mg total) by mouth daily.       Allergies: No Known Allergies  Family History: Family History  Problem Relation Age of Onset  . Kidney cancer Neg Hx   . Kidney disease Neg Hx   . Prostate cancer Neg Hx     Social History:  reports that he has never smoked. He has never used smokeless tobacco. He reports that he does not drink alcohol or use drugs.   Physical Exam: BP (!) 149/83   Pulse 79   Ht 5\' 8"  (1.727 m)   Wt 159 lb (72.1 kg)   BMI 24.18 kg/m   Constitutional:  Alert and oriented, No acute distress. HEENT: [place] AT, moist mucus membranes.  Trachea midline, no masses. Cardiovascular: No clubbing, cyanosis, or edema. Respiratory: Normal respiratory effort, no increased work of breathing. Skin: No rashes, bruises or suspicious lesions. Neurologic: Grossly intact, no focal deficits, moving all 4 extremities. Psychiatric: Normal mood and affect.  Pertinent Imaging: CLINICAL DATA:  Elevated PSA levels. Benign prostatic hypertrophy and prostatomegaly.  EXAM: MR PROSTATE WITHOUT AND WITH CONTRAST  TECHNIQUE: Multiplanar multisequence MRI images were obtained of the pelvis centered about the prostate. Pre and post contrast images  were obtained.  CONTRAST:  56mL GADAVIST GADOBUTROL 1 MMOL/ML IV SOLN  COMPARISON:  None.  FINDINGS: Prostate:  Region of interest # 1: PI-RADS 2.1 category 3 lesion of the right posterior transition zone near the apex with some heterogeneous signal intensity with obscured margins, but not markedly hyperintense on The equals 1400 diffusion-weighted images and not markedly hypointense on ADC map. This lesion measures 1.12 cubic cm (2.1 by 0.8 by 0.8 cm) and is appreciable on ADC map image 28/10 and on  diffusion-weighted image 28/11.  There is thinning of the peripheral zone.  Prominent benign prostatic hypertrophy noted, indenting the bladder base.  Volume: 3D volumetric analysis: Prostate volume 290.99 cubic cm (7.8 by 7.7 by 9.9 cm).  Transcapsular spread:  Absent  Seminal vesicle involvement: Absent  Neurovascular bundle involvement: Absent  Pelvic adenopathy: Absent  Bone metastasis: Absent  Other findings: Indirect left inguinal hernia containing adipose tissue. Left hydrocele.  IMPRESSION: 1. PI-RADS category 3 lesion of the right posterior transition zone near the apex. Targeting data sent to [place]. 2. Prominent benign prostatic hypertrophy, with prostate volume 290.99 cubic cm. 3. Indirect left inguinal hernia containing adipose tissue. Left hydrocele.   Electronically Signed   By: Van Clines M.D.   On: 05/11/2019 13:24  I have personally reviewed the images and agree with radiologist interpretation.   Assessment & Plan:    1. Elevated PSA MRI reviewed, indicated PI-RADS category 3 lesion of the right posterior transition zone near the apex, equivocal lesion Given most recent PSA  is stable within same range with some mild fluctuations when adjusted for prostate volume, PSA likely appropriate Recommend continued surveillance at this time with close f/lu Will consider fusion biopsy or additional imagings for comparison if PSA increases in future - he understands and is comfortable/ aggreeable with this plan Return in 4 months with PSA   2. BPH with obstruction/LUTS Chronically on finasteride-continue this medication Relatively asymptomatic   Return in about 4 months (around 09/12/2019) for MD follow up with PSA with prior.  [facility] [address], [place],  [address] [phone]  I, Lucas Mallow, am acting as a scribe for Dr. Hollice Espy,  I have reviewed the above  documentation for accuracy and completeness, and I agree with the above.   Hollice Espy, MD

## 2019-05-13 ENCOUNTER — Ambulatory Visit (INDEPENDENT_AMBULATORY_CARE_PROVIDER_SITE_OTHER): Payer: Medicare Other | Admitting: Urology

## 2019-05-13 ENCOUNTER — Encounter: Payer: Self-pay | Admitting: Urology

## 2019-05-13 ENCOUNTER — Other Ambulatory Visit: Payer: Self-pay

## 2019-05-13 VITALS — BP 149/83 | HR 79 | Ht 68.0 in | Wt 159.0 lb

## 2019-05-13 DIAGNOSIS — R972 Elevated prostate specific antigen [PSA]: Secondary | ICD-10-CM

## 2019-05-13 DIAGNOSIS — N138 Other obstructive and reflux uropathy: Secondary | ICD-10-CM | POA: Diagnosis not present

## 2019-05-13 DIAGNOSIS — N401 Enlarged prostate with lower urinary tract symptoms: Secondary | ICD-10-CM

## 2019-09-14 ENCOUNTER — Other Ambulatory Visit: Payer: Medicare Other

## 2019-09-14 ENCOUNTER — Other Ambulatory Visit: Payer: Self-pay

## 2019-09-14 DIAGNOSIS — N138 Other obstructive and reflux uropathy: Secondary | ICD-10-CM

## 2019-09-15 LAB — PSA: Prostate Specific Ag, Serum: 9.7 ng/mL — ABNORMAL HIGH (ref 0.0–4.0)

## 2019-09-15 NOTE — Progress Notes (Signed)
09/16/2019 12:55 PM   Brendan Carter 1946-01-16 [id number]  Referring provider: No referring provider defined for this encounter. Chief Complaint  Patient presents with  . Follow-up    HPI: Brendan Carter is a 74 y.o. male with a history of elevated/rising PSA and BPH w/ massive prostamegaly on chronic finasteride returns for a 4 month follow up.   Elevated PSA Personal historyelevated PSA with a history of massive BPH on rectal exam.   He has had multiple negative biopsies with significant prostamegaly in the setting of rising PSA. Due to concerns about sampling error a prostate MRI was ordered on previous visit.   Pt today follows up with a prostate MRI from 05/11/19 indicating PI-RADS category 3 lesion of the right posterior transition zone near the apex. Prominent benign prostatic hypertrophy with prostate volume 290.99 cubic cm.   PSA trend:  11.3 ng/mL on 11/27/2011.  07.8 ng/mL on 08/06/2012 10.5 ng/mL on 02/05/2013 08.2 ng/mL on 08/06/2013 08.7 ng/mL on 02/05/2014 20.6 ng/mL on 02/04/2015 16.4 ng/mL on 02/06/2015 10.7 ng/mL on 05/25/2015  8.44 ng/mL on 11/28/2015 8.1 ng/mL on 05/23/2016 8.5 Ng/mL on 12/03/2016 7.6ng/mL on 06/05/2017 10.0 ng/mL on 09/23/2018 13.4 ng/mL on 04/07/2019 9.7 ng/mL on 09/14/2019  BPH WITH LUTS He does have a history of massive BPH with prostamegaly, history of UTIs remotely.   He is currently on chronic finasteride. He is voiding well.    PMH: Past Medical History:  Diagnosis Date  . BPH (benign prostatic hyperplasia)   . Elevated PSA   . Hydrocele   . Hypertension   . Inguinal hernia    left  . Prostatitis   . Weak urinary stream     Surgical History: Past Surgical History:  Procedure Laterality Date  . HERNIA REPAIR Right   . hydrocelectomy Right 2013    Home Medications:  Allergies as of 09/16/2019   No Known Allergies     Medication List       Accurate as of September 16, 2019 12:55 PM. If you  have any questions, ask your nurse or doctor.        finasteride 5 MG tablet Commonly known as: PROSCAR Take 1 tablet (5 mg total) by mouth daily.       Allergies: No Known Allergies  Family History: Family History  Problem Relation Age of Onset  . Kidney cancer Neg Hx   . Kidney disease Neg Hx   . Prostate cancer Neg Hx     Social History:  reports that he has never smoked. He has never used smokeless tobacco. He reports that he does not drink alcohol and does not use drugs.   Physical Exam: Constitutional:  Alert and oriented, No acute distress. HEENT: [place] AT, moist mucus membranes.  Trachea midline, no masses. Cardiovascular: No clubbing, cyanosis, or edema. Respiratory: Normal respiratory effort, no increased work of breathing. Skin: No rashes, bruises or suspicious lesions. Neurologic: Grossly intact, no focal deficits, moving all 4 extremities. Psychiatric: Flat affect   Laboratory Data:  Lab Results  Component Value Date   CREATININE 1.20 05/11/2019    Assessment & Plan:    1. Elevated PSA  PSA is trending down which is reassuring.  Will continue to follow PSA on a 6 month basis   2. BPH with obstruction/LUTS Adequate voiding. Chronically on finasteride; continue this medication Continue DRE annually   Follow up in 6 months with PSA prior and DRE.   [facility] [address], [address]  [place], [place] [address] [phone]  I, Selena Batten, am acting as a scribe for Dr. Hollice Espy.  I have reviewed the above documentation for accuracy and completeness, and I agree with the above.   Hollice Espy, MD

## 2019-09-16 ENCOUNTER — Ambulatory Visit (INDEPENDENT_AMBULATORY_CARE_PROVIDER_SITE_OTHER): Payer: Medicare Other | Admitting: Urology

## 2019-09-16 ENCOUNTER — Other Ambulatory Visit: Payer: Self-pay

## 2019-09-16 DIAGNOSIS — R972 Elevated prostate specific antigen [PSA]: Secondary | ICD-10-CM

## 2019-09-18 ENCOUNTER — Other Ambulatory Visit: Payer: Self-pay | Admitting: Urology

## 2019-09-18 DIAGNOSIS — N400 Enlarged prostate without lower urinary tract symptoms: Secondary | ICD-10-CM

## 2019-12-27 ENCOUNTER — Other Ambulatory Visit: Payer: Self-pay | Admitting: Urology

## 2019-12-27 DIAGNOSIS — N400 Enlarged prostate without lower urinary tract symptoms: Secondary | ICD-10-CM

## 2020-03-17 ENCOUNTER — Other Ambulatory Visit: Payer: Self-pay

## 2020-03-17 DIAGNOSIS — R972 Elevated prostate specific antigen [PSA]: Secondary | ICD-10-CM

## 2020-03-18 ENCOUNTER — Other Ambulatory Visit: Payer: Medicare Other

## 2020-03-18 ENCOUNTER — Other Ambulatory Visit: Payer: Self-pay

## 2020-03-18 DIAGNOSIS — R972 Elevated prostate specific antigen [PSA]: Secondary | ICD-10-CM

## 2020-03-19 LAB — PSA: Prostate Specific Ag, Serum: 8.3 ng/mL — ABNORMAL HIGH (ref 0.0–4.0)

## 2020-03-22 ENCOUNTER — Ambulatory Visit (INDEPENDENT_AMBULATORY_CARE_PROVIDER_SITE_OTHER): Payer: Medicare Other | Admitting: Urology

## 2020-03-22 ENCOUNTER — Other Ambulatory Visit: Payer: Self-pay

## 2020-03-22 ENCOUNTER — Encounter: Payer: Self-pay | Admitting: Urology

## 2020-03-22 VITALS — BP 130/80 | HR 82 | Ht 68.0 in | Wt 159.0 lb

## 2020-03-22 DIAGNOSIS — R3912 Poor urinary stream: Secondary | ICD-10-CM

## 2020-03-22 DIAGNOSIS — R972 Elevated prostate specific antigen [PSA]: Secondary | ICD-10-CM | POA: Diagnosis not present

## 2020-03-22 DIAGNOSIS — N401 Enlarged prostate with lower urinary tract symptoms: Secondary | ICD-10-CM

## 2020-03-22 NOTE — Progress Notes (Signed)
03/22/2020 1:21 PM   Brendan Carter 1945-04-05 [id number]  Referring provider: No referring provider defined for this encounter.  Chief Complaint  Patient presents with  . Elevated PSA    HPI: Brendan Carter is a 75 y.o. male with a history of elevated/rising PSA and BPH w/ massive prostamegaly on chronic finasteride returns for a 6 month follow up.   No interval changes in his medical history since last visit.  Elevated PSA Personal historyelevated PSA with a history of massive BPH on rectal exam.  He has had multiple negative biopsies with significant prostamegaly in the setting of rising PSA. Due to concerns about sampling error a prostate MRI was orderedon previous visit.   Pt today follows up with a prostate MRI from 05/11/19 indicating PI-RADS category 3 lesion of the right posterior transition zone near the apex. Prominent benign prostatic hypertrophy with prostate volume 290.99 cubic cm.   PSA trend: 11.3 ng/mL on 11/27/2011.  07.8 ng/mL on 08/06/2012 10.5 ng/mL on 02/05/2013 08.2 ng/mL on 08/06/2013 08.7 ng/mL on 02/05/2014 20.6 ng/mL on 02/04/2015 16.4 ng/mL on 02/06/2015 10.7 ng/mL on 05/25/2015  8.44 ng/mL on 11/28/2015 8.1 ng/mL on 05/23/2016 8.5 Ng/mL on 12/03/2016 7.6ng/mL on 06/05/2017 10.0 ng/mL on 09/23/2018 13.4ng/mL on2/16/2021 9.7 ng/mL on 09/14/2019 8.3 ng/mL on 03/18/2020  BPH WITH LUTS He does have a history of massive BPH with prostamegaly, history of UTIs remotely.   He is currently on chronic finasteride. He is voiding well.   He does not necessarily get up at night to urinate.  He feels like his stream is okay.  He is not bothered by his urinary symptoms.  He does feel like he is able to empty all the way.    PMH: Past Medical History:  Diagnosis Date  . BPH (benign prostatic hyperplasia)   . Elevated PSA   . Hydrocele   . Hypertension   . Inguinal hernia    left  . Prostatitis   . Weak urinary stream      Surgical History: Past Surgical History:  Procedure Laterality Date  . HERNIA REPAIR Right   . hydrocelectomy Right 2013    Home Medications:  Allergies as of 03/22/2020   No Known Allergies     Medication List       Accurate as of March 22, 2020  1:21 PM. If you have any questions, ask your nurse or doctor.        finasteride 5 MG tablet Commonly known as: PROSCAR Take 1 tablet by mouth once daily       Allergies: No Known Allergies  Family History: Family History  Problem Relation Age of Onset  . Kidney cancer Neg Hx   . Kidney disease Neg Hx   . Prostate cancer Neg Hx     Social History:  reports that he has never smoked. He has never used smokeless tobacco. He reports that he does not drink alcohol and does not use drugs.   Physical Exam: BP 130/80   Pulse 82   Ht 5\' 8"  (1.727 m)   Wt 159 lb (72.1 kg)   BMI 24.18 kg/m   Constitutional:  Alert and oriented, No acute distress. HEENT: [place] AT, moist mucus membranes.  Trachea midline, no masses. Cardiovascular: No clubbing, cyanosis, or edema. Respiratory: Normal respiratory effort, no increased work of breathing. GI: Abdomen is soft, nontender, nondistended, no abdominal masses Rectal: Normal sphincter tone.  Massive prostamegaly, unable to palpate the entire gland, no nodularity. Skin: No  rashes, bruises or suspicious lesions. Neurologic: Grossly intact, no focal deficits, moving all 4 extremities. Psychiatric: Normal mood and affect.  Assessment & Plan:    1. Elevated PSA Personal history of significantly elevated PSA in the setting of massive prostamegaly which is in fact trending downwards which are reassuring  He had a fairly unremarkable prostate MRI last year indicative of massive prostamegaly  At this point time, feel that it is reasonable to follow him annually given downward trending PSA.  He is agreeable this plan.  2. Benign prostatic hyperplasia with weak urinary  stream Symptomatically doing well, continue finasteride only unless symptoms worsen   Hollice Espy, MD  [facility] [address], [place], [place] [address] [phone]

## 2020-03-25 ENCOUNTER — Other Ambulatory Visit: Payer: Self-pay | Admitting: Urology

## 2020-03-25 DIAGNOSIS — N400 Enlarged prostate without lower urinary tract symptoms: Secondary | ICD-10-CM

## 2020-06-27 ENCOUNTER — Other Ambulatory Visit: Payer: Self-pay | Admitting: Urology

## 2020-06-27 DIAGNOSIS — N400 Enlarged prostate without lower urinary tract symptoms: Secondary | ICD-10-CM

## 2020-09-23 ENCOUNTER — Other Ambulatory Visit: Payer: Self-pay | Admitting: Urology

## 2020-09-23 DIAGNOSIS — N400 Enlarged prostate without lower urinary tract symptoms: Secondary | ICD-10-CM

## 2020-09-26 ENCOUNTER — Other Ambulatory Visit: Payer: Self-pay | Admitting: Urology

## 2020-09-26 DIAGNOSIS — N400 Enlarged prostate without lower urinary tract symptoms: Secondary | ICD-10-CM

## 2020-12-27 ENCOUNTER — Other Ambulatory Visit: Payer: Self-pay | Admitting: Urology

## 2020-12-27 DIAGNOSIS — N400 Enlarged prostate without lower urinary tract symptoms: Secondary | ICD-10-CM

## 2021-03-20 ENCOUNTER — Other Ambulatory Visit: Payer: Self-pay

## 2021-03-20 ENCOUNTER — Other Ambulatory Visit: Payer: Medicare Other

## 2021-03-20 DIAGNOSIS — R972 Elevated prostate specific antigen [PSA]: Secondary | ICD-10-CM

## 2021-03-21 LAB — PSA: Prostate Specific Ag, Serum: 11.4 ng/mL — ABNORMAL HIGH (ref 0.0–4.0)

## 2021-03-21 NOTE — Progress Notes (Signed)
03/22/21 10:06 AM   Brendan Carter 1945/06/26 [id number]  Referring provider:  No referring provider defined for this encounter. Chief Complaint  Patient presents with   Benign Prostatic Hypertrophy   Elevated PSA     HPI: Brendan Carter is a 76 y.o.male with a personal history of elevated/rising PSA and BPH w/ massive prostamegaly who presents today for 1 year follow-up with IPSS, PSA, and PVR.   He has had multiple negative biopsies with significant prostamegaly in the setting of rising PSA. Due to concerns about sampling error a prostate MRI was ordered on previous visit.   MRI from 05/11/19 indicating PI-RADS category 3 lesion of the right posterior transition zone near the apex. Prominent benign prostatic hypertrophy with prostate volume 290.99 cubic cm.   His most recent PSA on 03/20/2021 was 11.4.   He reports that he feels that his is emptying well. He denies bleeding, blood, and infections. He reports that he has no change in his medical history.   PSA trend:  11.3 ng/mL on 11/27/2011.     07.8 ng/mL on 08/06/2012 10.5 ng/mL on 02/05/2013 08.2 ng/mL on 08/06/2013 08.7 ng/mL on 02/05/2014 20.6 ng/mL on 02/04/2015 16.4 ng/mL on 02/06/2015 10.7 ng/mL on 05/25/2015  8.44 ng/mL on 11/28/2015 8.1   ng/mL on 05/23/2016 8.5   Ng/mL on 12/03/2016 7.6  ng/mL on 06/05/2017 10.0 ng/mL on 09/23/2018 13.4 ng/mL on 04/07/2019 9.7 ng/mL on 09/14/2019  8.3 ng/mL on 03/18/2020 11.4 ng/ mL on 03/20/2021   IPSS     Row Name 03/22/21 0900         International Prostate Symptom Score   How often have you had the sensation of not emptying your bladder? Not at All     How often have you had to urinate less than every two hours? Less than 1 in 5 times     How often have you found you stopped and started again several times when you urinated? Not at All     How often have you found it difficult to postpone urination? Not at All     How often have you had a weak urinary stream? Not  at All     How often have you had to strain to start urination? Not at All     How many times did you typically get up at night to urinate? 1 Time     Total IPSS Score 2       Quality of Life due to urinary symptoms   If you were to spend the rest of your life with your urinary condition just the way it is now how would you feel about that? Mostly Satisfied              Score:  1-7 Mild 8-19 Moderate 20-35 Severe   PMH: Past Medical History:  Diagnosis Date   BPH (benign prostatic hyperplasia)    Elevated PSA    Hydrocele    Hypertension    Inguinal hernia    left   Prostatitis    Weak urinary stream     Surgical History: Past Surgical History:  Procedure Laterality Date   HERNIA REPAIR Right    hydrocelectomy Right 2013    Home Medications:  Allergies as of 03/22/2021   No Known Allergies      Medication List        Accurate as of March 22, 2021 10:06 AM. If you have any questions, ask your nurse or doctor.  finasteride 5 MG tablet Commonly known as: PROSCAR Take 1 tablet by mouth once daily        Allergies: No Known Allergies  Family History: Family History  Problem Relation Age of Onset   Kidney cancer Neg Hx    Kidney disease Neg Hx    Prostate cancer Neg Hx     Social History:  reports that he has never smoked. He has never used smokeless tobacco. He reports that he does not drink alcohol and does not use drugs.   Physical Exam: BP 133/90    Pulse 78    Ht 5\' 8"  (1.727 m)    Wt 159 lb (72.1 kg)    BMI 24.18 kg/m   Constitutional:  Alert and oriented, No acute distress. HEENT: [name] AT, moist mucus membranes.  Trachea midline, no masses. Cardiovascular: No clubbing, cyanosis, or edema. Respiratory: Normal respiratory effort, no increased work of breathing. Rectal: Normal sphincter tone,  Massive prostamegaly, unable to palpate the entire gland, no nodularity. Skin: No rashes, bruises or suspicious lesions. Neurologic:  Grossly intact, no focal deficits, moving all 4 extremities. Psychiatric: Normal mood and affect.  Laboratory Data:  Lab Results  Component Value Date   CREATININE 1.20 05/11/2019    Pertinent Imaging: Results for orders placed or performed in visit on 03/22/21  BLADDER SCAN AMB NON-IMAGING  Result Value Ref Range   Scan Result 6     Assessment & Plan:   Elevated PSA  - Personal history of significantly elevated PSA in the setting of massive prostamegaly  - PSA has trended slightly upwards, but has remained lower than previous PSAs which is reassuring.  - Will continue to follow him annually with PSA   2. BPH with weak urinary stream  - Emptying well today  - Symptomatically doing well  - Continue finasteride. Prescription sent today.   Return in 1 year for IPSS, PSA and PVR  I,Kailey Littlejohn,acting as a scribe for Hollice Espy, MD.,have documented all relevant documentation on the behalf of Hollice Espy, MD,as directed by  Hollice Espy, MD while in the presence of Hollice Espy, MD.  I have reviewed the above documentation for accuracy and completeness, and I agree with the above.   Hollice Espy, MD   [facility] [address] [place], [place] [address] [phone]

## 2021-03-22 ENCOUNTER — Ambulatory Visit (INDEPENDENT_AMBULATORY_CARE_PROVIDER_SITE_OTHER): Payer: Medicare Other | Admitting: Urology

## 2021-03-22 ENCOUNTER — Encounter: Payer: Self-pay | Admitting: Urology

## 2021-03-22 ENCOUNTER — Other Ambulatory Visit: Payer: Self-pay

## 2021-03-22 VITALS — BP 133/90 | HR 78 | Ht 68.0 in | Wt 159.0 lb

## 2021-03-22 DIAGNOSIS — N400 Enlarged prostate without lower urinary tract symptoms: Secondary | ICD-10-CM

## 2021-03-22 DIAGNOSIS — N401 Enlarged prostate with lower urinary tract symptoms: Secondary | ICD-10-CM | POA: Diagnosis not present

## 2021-03-22 DIAGNOSIS — R3912 Poor urinary stream: Secondary | ICD-10-CM | POA: Diagnosis not present

## 2021-03-22 LAB — BLADDER SCAN AMB NON-IMAGING: Scan Result: 6

## 2021-03-22 MED ORDER — FINASTERIDE 5 MG PO TABS: 5.0000 mg | ORAL_TABLET | Freq: Every day | ORAL | 3 refills | Status: DC

## 2021-07-24 IMAGING — MR MR PROSTATE WO/W CM
56 series · 56 of 56 positions shown · IV contrast (7ml Gadavist)
Comparison: None.

CLINICAL DATA: Elevated PSA levels. Benign prostatic hypertrophy
and prostatomegaly.

EXAM:
MR PROSTATE WITHOUT AND WITH CONTRAST
TECHNIQUE: Multiplanar multisequence MRI images were obtained of the pelvis
centered about the prostate. Pre and post contrast images were
obtained.
CONTRAST:  7mL GADAVIST GADOBUTROL 1 MMOL/ML IV SOLN

[Series 3: ax in&(date) · axial · 6.0mm · 0.74mm/px · 1 of 30 slices shown]
[im 1/30]
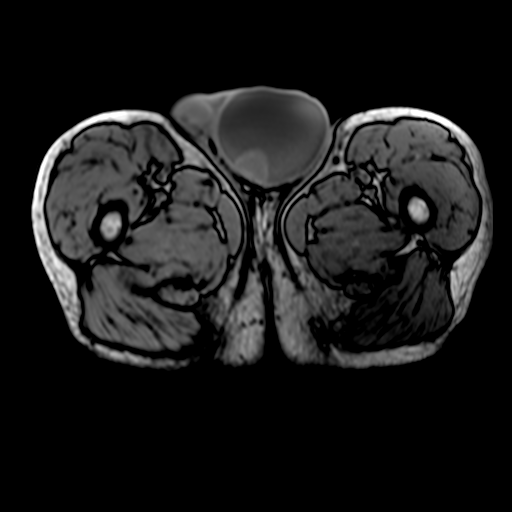

[Series 4: bSSFP fat-sat · axial · 8.0mm · 0.74mm/px · 1 of 25 slices shown]
[im 1/25]
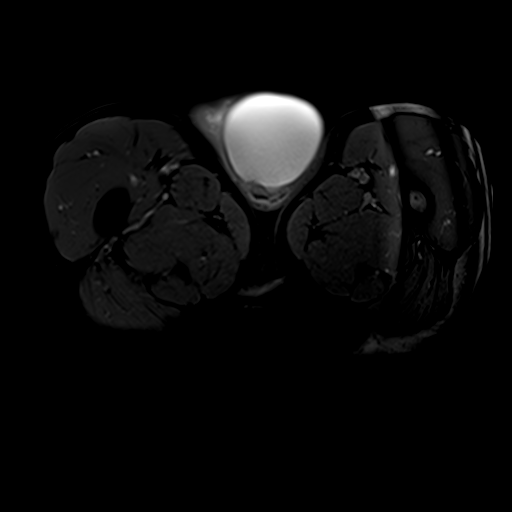

[Series 5: T2 · coronal · 3.0mm · 0.70mm/px · 1 of 32 slices shown (1 of 3)]
[im 1/32]
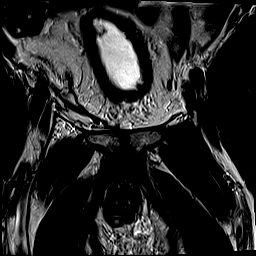

[Series 6: T2 · sagittal · 3.5mm · 0.31mm/px · 1 of 25 slices shown (2 of 3)]
[im 1/25]
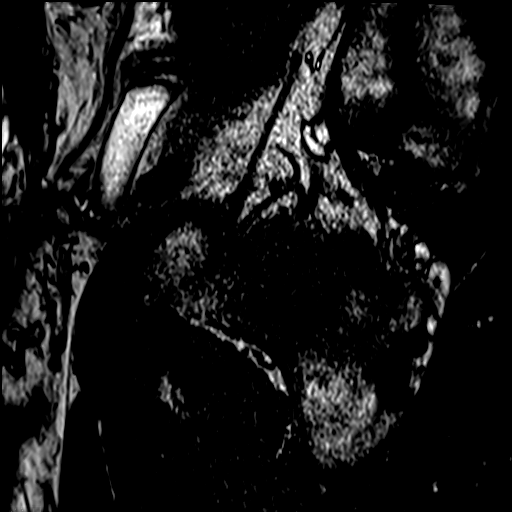

[Series 7: T1 · axial · 3.0mm · 0.35mm/px · 1 of 32 slices shown (1 of 47)]
[im 1/32]
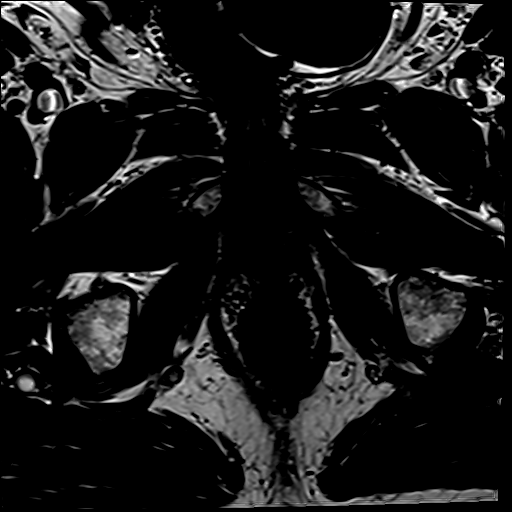

[Series 8: T2 · axial · 3.5mm · 0.56mm/px · 1 of 32 slices shown (3 of 3)]
[im 1/32]
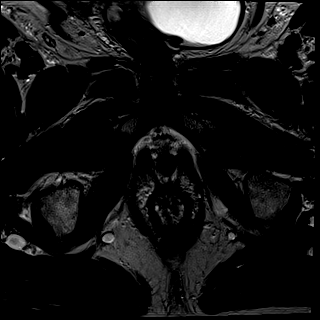

[Series 9: DWI · axial · 3.0mm · 0.86mm/px · 1 of 108 slices shown (1 of 3)]
[im 1/108]
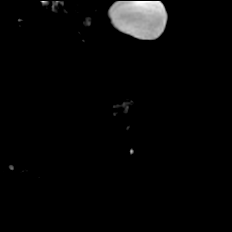

[Series 10: DWI · axial · 3.0mm · 0.86mm/px · 1 of 36 slices shown (2 of 3)]
[im 1/36]
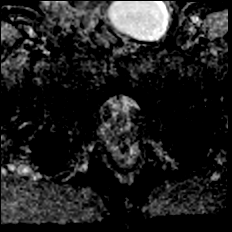

[Series 11: DWI · axial · 3.0mm · 0.86mm/px · 1 of 36 slices shown (3 of 3)]
[im 1/36]
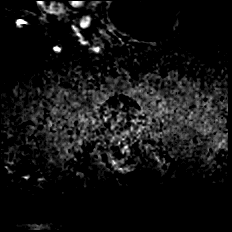

[Series 12: [id number] · axial · 1.0mm · 1.04mm/px · 1 of 104 slices shown]
[im 1/104]
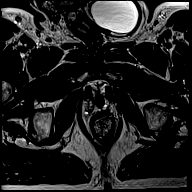

[Series 13: T1 · axial · 3.0mm · 1.15mm/px · 1 of 36 slices shown (2 of 47)]
[im 1/36]
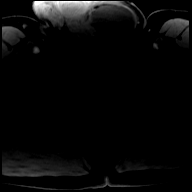

[Series 14: T1 · axial · 3.0mm · 1.15mm/px · 1 of 36 slices shown (3 of 47)]
[im 1/36]
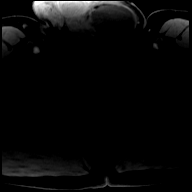

[Series 15: T1 · axial · 3.0mm · 1.15mm/px · 1 of 36 slices shown (4 of 47)]
[im 1/36]
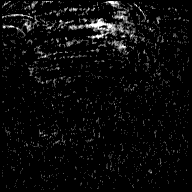

[Series 16: T1 · axial · 3.0mm · 1.15mm/px · 1 of 36 slices shown (5 of 47)]
[im 1/36]
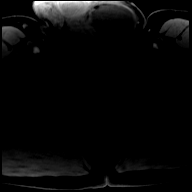

[Series 17: T1 · axial · 3.0mm · 1.15mm/px · 1 of 36 slices shown (6 of 47)]
[im 1/36]
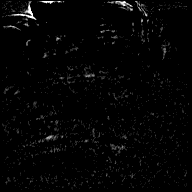

[Series 18: T1 · axial · 3.0mm · 1.15mm/px · 1 of 36 slices shown (7 of 47)]
[im 1/36]
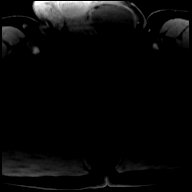

[Series 19: T1 · axial · 3.0mm · 1.15mm/px · 1 of 36 slices shown (8 of 47)]
[im 1/36]
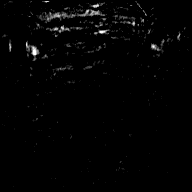

[Series 20: T1 · axial · 3.0mm · 1.15mm/px · 1 of 36 slices shown (9 of 47)]
[im 1/36]
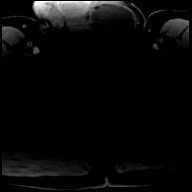

[Series 21: T1 · axial · 3.0mm · 1.15mm/px · 1 of 36 slices shown (10 of 47)]
[im 1/36]
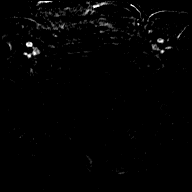

[Series 22: T1 · axial · 3.0mm · 1.15mm/px · 1 of 36 slices shown (11 of 47)]
[im 1/36]
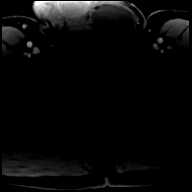

[Series 23: T1 · axial · 3.0mm · 1.15mm/px · 1 of 36 slices shown (12 of 47)]
[im 1/36]
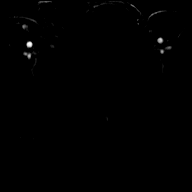

[Series 24: T1 · axial · 3.0mm · 1.15mm/px · 1 of 36 slices shown (13 of 47)]
[im 1/36]
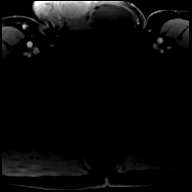

[Series 25: T1 · axial · 3.0mm · 1.15mm/px · 1 of 36 slices shown (14 of 47)]
[im 1/36]
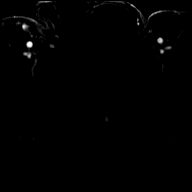

[Series 26: T1 · axial · 3.0mm · 1.15mm/px · 1 of 36 slices shown (15 of 47)]
[im 1/36]
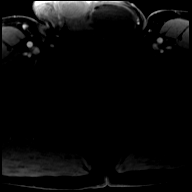

[Series 27: T1 · axial · 3.0mm · 1.15mm/px · 1 of 36 slices shown (16 of 47)]
[im 1/36]
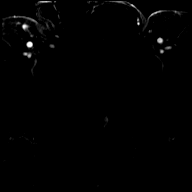

[Series 28: T1 · axial · 3.0mm · 1.15mm/px · 1 of 36 slices shown (17 of 47)]
[im 1/36]
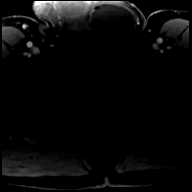

[Series 29: T1 · axial · 3.0mm · 1.15mm/px · 1 of 36 slices shown (18 of 47)]
[im 1/36]
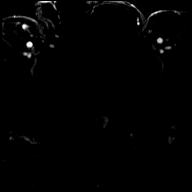

[Series 30: T1 · axial · 3.0mm · 1.15mm/px · 1 of 36 slices shown (19 of 47)]
[im 1/36]
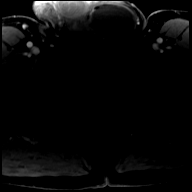

[Series 31: T1 · axial · 3.0mm · 1.15mm/px · 1 of 36 slices shown (20 of 47)]
[im 1/36]
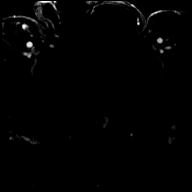

[Series 32: T1 · axial · 3.0mm · 1.15mm/px · 1 of 36 slices shown (21 of 47)]
[im 1/36]
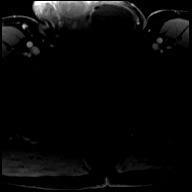

[Series 33: T1 · axial · 3.0mm · 1.15mm/px · 1 of 36 slices shown (22 of 47)]
[im 1/36]
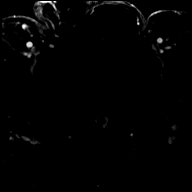

[Series 34: T1 · axial · 3.0mm · 1.15mm/px · 1 of 36 slices shown (23 of 47)]
[im 1/36]
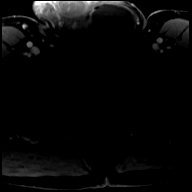

[Series 35: T1 · axial · 3.0mm · 1.15mm/px · 1 of 36 slices shown (24 of 47)]
[im 1/36]
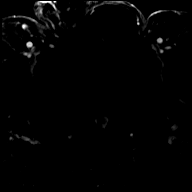

[Series 36: T1 · axial · 3.0mm · 1.15mm/px · 1 of 36 slices shown (25 of 47)]
[im 1/36]
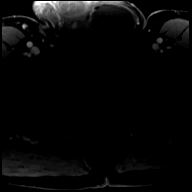

[Series 37: T1 · axial · 3.0mm · 1.15mm/px · 1 of 36 slices shown (26 of 47)]
[im 1/36]
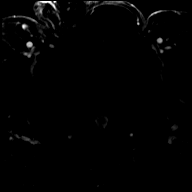

[Series 38: T1 · axial · 3.0mm · 1.15mm/px · 1 of 36 slices shown (27 of 47)]
[im 1/36]
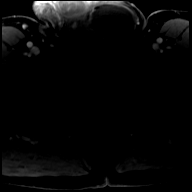

[Series 39: T1 · axial · 3.0mm · 1.15mm/px · 1 of 36 slices shown (28 of 47)]
[im 1/36]
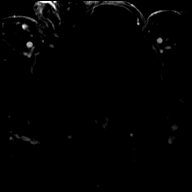

[Series 40: T1 · axial · 3.0mm · 1.15mm/px · 1 of 36 slices shown (29 of 47)]
[im 1/36]
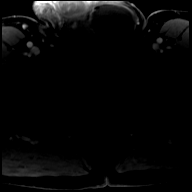

[Series 41: T1 · axial · 3.0mm · 1.15mm/px · 1 of 36 slices shown (30 of 47)]
[im 1/36]
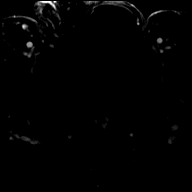

[Series 42: T1 · axial · 3.0mm · 1.15mm/px · 1 of 36 slices shown (31 of 47)]
[im 1/36]
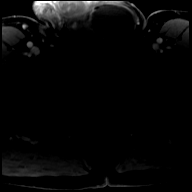

[Series 43: T1 · axial · 3.0mm · 1.15mm/px · 1 of 36 slices shown (32 of 47)]
[im 1/36]
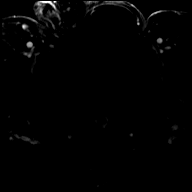

[Series 44: T1 · axial · 3.0mm · 1.15mm/px · 1 of 36 slices shown (33 of 47)]
[im 1/36]
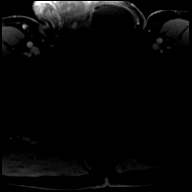

[Series 45: T1 · axial · 3.0mm · 1.15mm/px · 1 of 36 slices shown (34 of 47)]
[im 1/36]
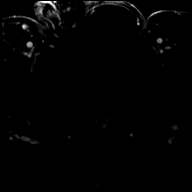

[Series 46: T1 · axial · 3.0mm · 1.15mm/px · 1 of 36 slices shown (35 of 47)]
[im 1/36]
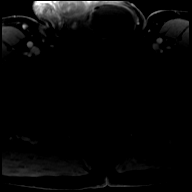

[Series 47: T1 · axial · 3.0mm · 1.15mm/px · 1 of 36 slices shown (36 of 47)]
[im 1/36]
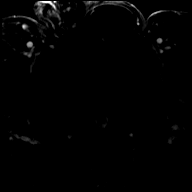

[Series 48: T1 · axial · 3.0mm · 1.15mm/px · 1 of 36 slices shown (37 of 47)]
[im 1/36]
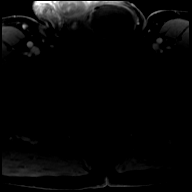

[Series 49: T1 · axial · 3.0mm · 1.15mm/px · 1 of 36 slices shown (38 of 47)]
[im 1/36]
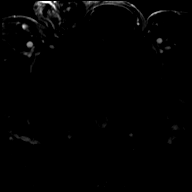

[Series 50: T1 · axial · 3.0mm · 1.15mm/px · 1 of 36 slices shown (39 of 47)]
[im 1/36]
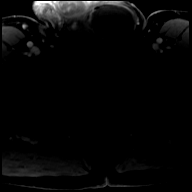

[Series 51: T1 · axial · 3.0mm · 1.15mm/px · 1 of 36 slices shown (40 of 47)]
[im 1/36]
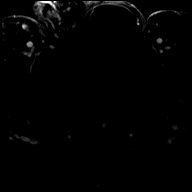

[Series 52: T1 · axial · 3.0mm · 1.15mm/px · 1 of 36 slices shown (41 of 47)]
[im 1/36]
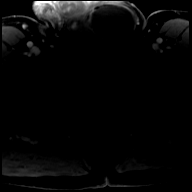

[Series 53: T1 · axial · 3.0mm · 1.15mm/px · 1 of 36 slices shown (42 of 47)]
[im 1/36]
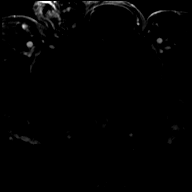

[Series 54: T1 · axial · 3.0mm · 1.15mm/px · 1 of 36 slices shown (43 of 47)]
[im 1/36]
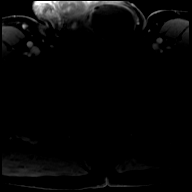

[Series 55: T1 · axial · 3.0mm · 1.15mm/px · 1 of 36 slices shown (44 of 47)]
[im 1/36]
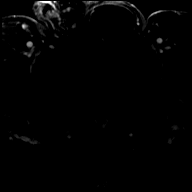

[Series 56: T1 · axial · 3.0mm · 1.15mm/px · 1 of 36 slices shown (45 of 47)]
[im 1/36]
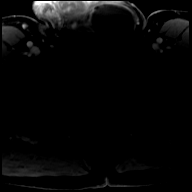

[Series 57: T1 · axial · 3.0mm · 1.15mm/px · 1 of 36 slices shown (46 of 47)]
[im 1/36]
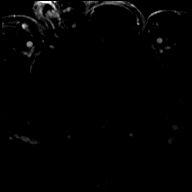

[Series 58: T1 · axial · 3.0mm · 1.15mm/px · 1 of 36 slices shown (47 of 47)]
[im 1/36]
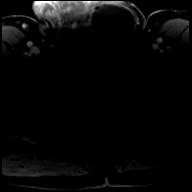

[56 of 56 positions shown; findings below may reference images not displayed]

FINDINGS: Prostate:

Region of interest # 1: PI-RADS 2.1 category 3 lesion of the right
posterior transition zone near the apex with some heterogeneous
signal intensity with obscured margins, but not markedly
hyperintense on The equals 9811 diffusion-weighted images and not
markedly hypointense on ADC map. This lesion measures 1.12 cubic cm
(2.1 by 0.8 by 0.8 cm) and is appreciable on ADC map image [DATE] and
on diffusion-weighted image [DATE].

There is thinning of the peripheral zone.

Prominent benign prostatic hypertrophy noted, indenting the bladder
base.

Volume: 3D volumetric analysis: Prostate volume 290.99 cubic cm (7.8
by 7.7 by 9.9 cm).

Transcapsular spread:  Absent

Seminal vesicle involvement: Absent

Neurovascular bundle involvement: Absent

Pelvic adenopathy: Absent

Bone metastasis: Absent

Other findings: Indirect left inguinal hernia containing adipose
tissue. Left hydrocele.
IMPRESSION: 1. PI-RADS category 3 lesion of the right posterior transition zone
near the apex. Targeting data sent to [facility].
2. Prominent benign prostatic hypertrophy, with prostate volume
290.99 cubic cm.
3. Indirect left inguinal hernia containing adipose tissue. Left
hydrocele.

## 2021-07-27 LAB — COLOGUARD: COLOGUARD: POSITIVE — AB

## 2021-09-28 ENCOUNTER — Other Ambulatory Visit: Payer: Self-pay | Admitting: Infectious Diseases

## 2021-09-28 DIAGNOSIS — E538 Deficiency of other specified B group vitamins: Secondary | ICD-10-CM

## 2021-09-28 DIAGNOSIS — R413 Other amnesia: Secondary | ICD-10-CM

## 2021-12-26 ENCOUNTER — Other Ambulatory Visit: Payer: Self-pay | Admitting: Infectious Diseases

## 2021-12-26 DIAGNOSIS — R413 Other amnesia: Secondary | ICD-10-CM

## 2022-01-19 ENCOUNTER — Ambulatory Visit
Admission: RE | Admit: 2022-01-19 | Discharge: 2022-01-19 | Disposition: A | Discharge status: Home / Self Care | Payer: Medicare Other | Source: Ambulatory Visit | Attending: Infectious Diseases | Admitting: Infectious Diseases

## 2022-01-19 DIAGNOSIS — R413 Other amnesia: Secondary | ICD-10-CM | POA: Diagnosis not present

## 2022-02-13 ENCOUNTER — Inpatient Hospital Stay: Payer: Medicare Other | Attending: Oncology | Admitting: Oncology

## 2022-02-13 ENCOUNTER — Inpatient Hospital Stay: Payer: Medicare Other

## 2022-02-13 ENCOUNTER — Encounter: Payer: Self-pay | Admitting: Oncology

## 2022-02-13 VITALS — BP 120/79 | HR 73 | Temp 96.7°F | Wt 140.1 lb

## 2022-02-13 DIAGNOSIS — R634 Abnormal weight loss: Secondary | ICD-10-CM

## 2022-02-13 DIAGNOSIS — D72810 Lymphocytopenia: Secondary | ICD-10-CM

## 2022-02-13 DIAGNOSIS — D539 Nutritional anemia, unspecified: Secondary | ICD-10-CM

## 2022-02-13 LAB — CBC WITH DIFFERENTIAL/PLATELET
Abs Immature Granulocytes: 0.02 10*3/uL (ref 0.00–0.07)
Basophils Absolute: 0 10*3/uL (ref 0.0–0.1)
Basophils Relative: 0 %
Eosinophils Absolute: 0.1 10*3/uL (ref 0.0–0.5)
Eosinophils Relative: 1 %
HCT: 38.1 % — ABNORMAL LOW (ref 39.0–52.0)
Hemoglobin: 12.5 g/dL — ABNORMAL LOW (ref 13.0–17.0)
Immature Granulocytes: 1 %
Lymphocytes Relative: 21 %
Lymphs Abs: 0.8 10*3/uL (ref 0.7–4.0)
MCH: 34.2 pg — ABNORMAL HIGH (ref 26.0–34.0)
MCHC: 32.8 g/dL (ref 30.0–36.0)
MCV: 104.1 fL — ABNORMAL HIGH (ref 80.0–100.0)
Monocytes Absolute: 0.2 10*3/uL (ref 0.1–1.0)
Monocytes Relative: 6 %
Neutro Abs: 2.7 10*3/uL (ref 1.7–7.7)
Neutrophils Relative %: 71 %
Platelets: 164 10*3/uL (ref 150–400)
RBC: 3.66 MIL/uL — ABNORMAL LOW (ref 4.22–5.81)
RDW: 12.2 % (ref 11.5–15.5)
WBC: 3.8 10*3/uL — ABNORMAL LOW (ref 4.0–10.5)
nRBC: 0 % (ref 0.0–0.2)

## 2022-02-13 LAB — COMPREHENSIVE METABOLIC PANEL
ALT: 21 U/L (ref 0–44)
AST: 23 U/L (ref 15–41)
Albumin: 4 g/dL (ref 3.5–5.0)
Alkaline Phosphatase: 81 U/L (ref 38–126)
Anion gap: 3 — ABNORMAL LOW (ref 5–15)
BUN: 23 mg/dL (ref 8–23)
CO2: 30 mmol/L (ref 22–32)
Calcium: 8.9 mg/dL (ref 8.9–10.3)
Chloride: 110 mmol/L (ref 98–111)
Creatinine, Ser: 1.2 mg/dL (ref 0.61–1.24)
GFR, Estimated: >60 mL/min (ref >60)
Glucose, Bld: 94 mg/dL (ref 70–99)
Potassium: 4 mmol/L (ref 3.5–5.1)
Sodium: 143 mmol/L (ref 135–145)
Total Bilirubin: 0.5 mg/dL (ref 0.3–1.2)
Total Protein: 6.2 g/dL — ABNORMAL LOW (ref 6.5–8.1)

## 2022-02-13 LAB — TSH: TSH: 1.875 u[IU]/mL (ref 0.350–4.500)

## 2022-02-13 LAB — LACTATE DEHYDROGENASE: LDH: 147 U/L (ref 98–192)

## 2022-02-13 NOTE — Assessment & Plan Note (Signed)
Normal vitamin B12 level and folate level. ?  Underlying bone marrow disorder.  Pending above workup. Check zinc and copper level.

## 2022-02-13 NOTE — Assessment & Plan Note (Signed)
Recommend check CBC, CMP, peripheral blood flow cytometry, multiple myeloma panel, light chain ratio, LDH, hepatitis panel.

## 2022-02-13 NOTE — Assessment & Plan Note (Signed)
Unknown etiology.  Pending above workup.

## 2022-02-13 NOTE — Progress Notes (Signed)
Hematology/Oncology Consult Note [phone] [phone]      Patient Care Team: Patient, No Pcp Per as PCP - General (General Practice)  REFERRING PROVIDER: Leonel Ramsay, MD  CHIEF COMPLAINTS/REASON FOR VISIT:  Evaluation of leukopenia  HISTORY OF PRESENTING ILLNESS:  Brendan Carter is a 76 y.o. male who was seen in consultation at the request of Leonel Ramsay, MD for evaluation of leukopenia.  Patient had blood work done on 09/20/2021, CBC showed white count of 3.7, differential shows lymphocytopenia.  Hemoglobin 13.4, MCV 104.6.  Vitamin B12 265.  Patient received 4 doses of vitamin B12 injections 02/02/2022 repeat B12 level >1500.  TSH was normal.  CBC shows persistent leukopenia, predominantly lymphocytopenia.  Persistent mild decrease of hemoglobin of 13.3, MCV 106. Patient denies any routine alcohol use.  Denies any recent infection He has lost weight, February 2023, he weighed 159 pounds.  Currently he weighs 140 pounds.  Appetite is fair and wife reports patient is eating his usual portion of food.  Denies night sweats or fever. Denies any over-the-counter supplement intake.   MEDICAL HISTORY:  Past Medical History:  Diagnosis Date   BPH (benign prostatic hyperplasia)    Elevated PSA    Hydrocele    Hypertension    Inguinal hernia    left   Prostatitis    Weak urinary stream     SURGICAL HISTORY: Past Surgical History:  Procedure Laterality Date   HERNIA REPAIR Right    hydrocelectomy Right 2013    SOCIAL HISTORY: Social History   Socioeconomic History   Marital status: Single    Spouse name: Not on file   Number of children: Not on file   Years of education: Not on file   Highest education level: Not on file  Occupational History   Not on file  Tobacco Use   Smoking status: Never   Smokeless tobacco: Never  Substance and Sexual Activity   Alcohol use: No    Alcohol/week: 0.0 standard drinks of alcohol   Drug use:  No   Sexual activity: Not on file  Other Topics Concern   Not on file  Social History Narrative   Not on file   Social Determinants of Health   Financial Resource Strain: Not on file  Food Insecurity: Not on file  Transportation Needs: Not on file  Physical Activity: Not on file  Stress: Not on file  Social Connections: Not on file  Intimate Partner Violence: Not on file    FAMILY HISTORY: Family History  Problem Relation Age of Onset   Kidney cancer Neg Hx    Kidney disease Neg Hx    Prostate cancer Neg Hx     ALLERGIES:  has No Known Allergies.  MEDICATIONS:  Current Outpatient Medications  Medication Sig Dispense Refill   finasteride (PROSCAR) 5 MG tablet Take 1 tablet (5 mg total) by mouth daily. 90 tablet 3   mirtazapine (REMERON) 30 MG tablet Take by mouth.     No current facility-administered medications for this visit.    Review of Systems  Constitutional:  Positive for unexpected weight change. Negative for appetite change, chills, fatigue and fever.  HENT:   Negative for hearing loss and voice change.   Eyes:  Negative for eye problems and icterus.  Respiratory:  Negative for chest tightness, cough and shortness of breath.   Cardiovascular:  Negative for chest pain and leg swelling.  Gastrointestinal:  Negative for abdominal distention and abdominal pain.  Endocrine: Negative  for hot flashes.  Genitourinary:  Negative for difficulty urinating, dysuria and frequency.   Musculoskeletal:  Negative for arthralgias.  Skin:  Negative for itching and rash.  Neurological:  Negative for light-headedness and numbness.  Hematological:  Negative for adenopathy. Does not bruise/bleed easily.  Psychiatric/Behavioral:  Negative for confusion.     PHYSICAL EXAMINATION: ECOG PERFORMANCE STATUS: 0 - Asymptomatic Vitals:   02/13/22 1033  BP: 120/79  Pulse: 73  Temp: (!) 96.7 F (35.9 C)  SpO2: 100%   Filed Weights   02/13/22 1033  Weight: 140 lb 1.6 oz (63.5  kg)    Physical Exam Constitutional:      General: He is not in acute distress. HENT:     Head: Normocephalic and atraumatic.  Eyes:     General: No scleral icterus. Cardiovascular:     Rate and Rhythm: Normal rate and regular rhythm.     Heart sounds: Normal heart sounds.  Pulmonary:     Effort: Pulmonary effort is normal. No respiratory distress.     Breath sounds: No wheezing.  Abdominal:     General: Bowel sounds are normal. There is no distension.     Palpations: Abdomen is soft.  Musculoskeletal:        General: No deformity. Normal range of motion.     Cervical back: Normal range of motion and neck supple.  Skin:    General: Skin is warm and dry.     Findings: No erythema or rash.  Neurological:     Mental Status: He is alert and oriented to person, place, and time. Mental status is at baseline.     Cranial Nerves: No cranial nerve deficit.     Coordination: Coordination normal.  Psychiatric:        Mood and Affect: Mood normal.      LABORATORY DATA:  I have reviewed the data as listed     Latest Ref Rng & Units 02/13/2022   11:15 AM 01/13/2015    6:27 AM 01/12/2015    9:03 PM  CBC  WBC 4.0 - 10.5 K/uL 3.8  18.8  19.3   Hemoglobin 13.0 - 17.0 g/dL 12.5  12.0  13.1   Hematocrit 39.0 - 52.0 % 38.1  35.6  39.4   Platelets 150 - 400 K/uL 164  145  155       Latest Ref Rng & Units 02/13/2022   11:15 AM 05/11/2019   11:57 AM 01/14/2015    7:06 AM  CMP  Glucose 70 - 99 mg/dL 94   112   BUN 8 - 23 mg/dL 23   17   Creatinine 0.61 - 1.24 mg/dL 1.20  1.20  1.02   Sodium 135 - 145 mmol/L 143   136   Potassium 3.5 - 5.1 mmol/L 4.0   3.8   Chloride 98 - 111 mmol/L 110   106   CO2 22 - 32 mmol/L 30   25   Calcium 8.9 - 10.3 mg/dL 8.9   8.1   Total Protein 6.5 - 8.1 g/dL 6.2     Total Bilirubin 0.3 - 1.2 mg/dL 0.5     Alkaline Phos 38 - 126 U/L 81     AST 15 - 41 U/L 23     ALT 0 - 44 U/L 21        RADIOGRAPHIC STUDIES: I have personally reviewed the  radiological images as listed and agreed with the findings in the report. CT HEAD WO CONTRAST (5MM)  Result Date: 01/21/2022 CLINICAL DATA:  Memory loss. EXAM: CT HEAD WITHOUT CONTRAST TECHNIQUE: Contiguous axial images were obtained from the base of the skull through the vertex without intravenous contrast. RADIATION DOSE REDUCTION: This exam was performed according to the departmental dose-optimization program which includes automated exposure control, adjustment of the mA and/or kV according to patient size and/or use of iterative reconstruction technique. COMPARISON:  None Available. FINDINGS: Brain: Ventricles, cisterns and other CSF spaces are normal. There is no mass, mass effect, shift midline structures or acute hemorrhage. No evidence of acute infarction. Very minimal chronic ischemic microvascular disease. Vascular: No hyperdense vessel or unexpected calcification. Skull: Normal. Negative for fracture or focal lesion. Sinuses/Orbits: No acute finding. Other: None. IMPRESSION: 1. No acute findings. 2. Minimal chronic ischemic microvascular disease. Electronically Signed   By: Marin Olp M.D.   On: 01/21/2022 12:26      ASSESSMENT & PLAN:   Lymphopenia Recommend check CBC, CMP, peripheral blood flow cytometry, multiple myeloma panel, light chain ratio, LDH, hepatitis panel.  Anemia, macrocytic Normal vitamin B12 level and folate level. ?  Underlying bone marrow disorder.  Pending above workup. Check zinc and copper level.  Weight loss Unknown etiology.  Pending above workup.     # Patient follow-up with me in approximately 3-4 weeks to review the above results.   Orders Placed This Encounter  Procedures   Comprehensive metabolic panel    Standing Status:   Future    Number of Occurrences:   1    Standing Expiration Date:   02/14/2023   CBC with Differential/Platelet    Standing Status:   Future    Number of Occurrences:   1    Standing Expiration Date:   02/14/2023   Flow  cytometry panel-leukemia/lymphoma work-up    Standing Status:   Future    Number of Occurrences:   1    Standing Expiration Date:   02/14/2023   Multiple Myeloma Panel (SPEP&IFE w/QIG)    Standing Status:   Future    Number of Occurrences:   1    Standing Expiration Date:   02/14/2023   Kappa/lambda light chains    Standing Status:   Future    Number of Occurrences:   1    Standing Expiration Date:   02/14/2023   Lactate dehydrogenase    Standing Status:   Future    Number of Occurrences:   1    Standing Expiration Date:   02/14/2023   Copper, serum    Standing Status:   Future    Number of Occurrences:   1    Standing Expiration Date:   02/14/2023   Zinc    Standing Status:   Future    Number of Occurrences:   1    Standing Expiration Date:   02/14/2023   TSH    Standing Status:   Future    Number of Occurrences:   1    Standing Expiration Date:   02/14/2023   Hepatitis panel, acute    Standing Status:   Future    Number of Occurrences:   1    Standing Expiration Date:   02/14/2023  Follow-up 3 to 4 weeks to review results. All questions were answered. The patient knows to call the clinic with any problems questions or concerns.  Cc Leonel Ramsay, MD  Thank you for this kind referral and the opportunity to participate in the care of this patient. A copy of today's note is  routed to referring provider      Earlie Server, MD, PhD Hematology Oncology 02/13/2022

## 2022-02-13 NOTE — Progress Notes (Signed)
Patient is referred here by Dr. Ola Spurr for anemia and leukopenia.

## 2022-02-14 LAB — HEPATITIS PANEL, ACUTE
HCV Ab: NONREACTIVE
Hep A IgM: NONREACTIVE
Hep B C IgM: NONREACTIVE
Hepatitis B Surface Ag: NONREACTIVE

## 2022-02-14 LAB — KAPPA/LAMBDA LIGHT CHAINS
Kappa free light chain: 21.1 mg/L — ABNORMAL HIGH (ref 3.3–19.4)
Kappa, lambda light chain ratio: 0.13 — ABNORMAL LOW (ref 0.26–1.65)
Lambda free light chains: 168.4 mg/L — ABNORMAL HIGH (ref 5.7–26.3)

## 2022-02-15 LAB — COMP PANEL: LEUKEMIA/LYMPHOMA

## 2022-02-16 ENCOUNTER — Other Ambulatory Visit: Payer: Self-pay

## 2022-02-16 ENCOUNTER — Telehealth: Payer: Self-pay

## 2022-02-16 DIAGNOSIS — D539 Nutritional anemia, unspecified: Secondary | ICD-10-CM

## 2022-02-16 NOTE — Telephone Encounter (Signed)
Multiple attempts made today to contact pt. No answer, but detailed message was left for pt to come and pick up urine jug. Also attempted to call wife, but no answer.

## 2022-02-16 NOTE — Telephone Encounter (Signed)
-----   Message from Earlie Server, MD sent at 02/16/2022  8:33 AM EST ----- I advise him to get 24 hour UPEP done. Please arrange. Thanks.

## 2022-02-20 LAB — ZINC: Zinc: 64 ug/dL (ref 44–115)

## 2022-02-20 LAB — MULTIPLE MYELOMA PANEL, SERUM
Albumin SerPl Elph-Mcnc: 3.6 g/dL (ref 2.9–4.4)
Albumin/Glob SerPl: 1.6 (ref 0.7–1.7)
Alpha 1: 0.2 g/dL (ref 0.0–0.4)
Alpha2 Glob SerPl Elph-Mcnc: 0.5 g/dL (ref 0.4–1.0)
B-Globulin SerPl Elph-Mcnc: 0.7 g/dL (ref 0.7–1.3)
Gamma Glob SerPl Elph-Mcnc: 0.9 g/dL (ref 0.4–1.8)
Globulin, Total: 2.3 g/dL (ref 2.2–3.9)
IgA: 93 mg/dL (ref 61–437)
IgG (Immunoglobin G), Serum: 986 mg/dL (ref 603–1613)
IgM (Immunoglobulin M), Srm: 28 mg/dL (ref 15–143)
Total Protein ELP: 5.9 g/dL — ABNORMAL LOW (ref 6.0–8.5)

## 2022-02-20 LAB — COPPER, SERUM: Copper: 85 ug/dL (ref 69–132)

## 2022-02-21 DIAGNOSIS — R634 Abnormal weight loss: Secondary | ICD-10-CM | POA: Diagnosis not present

## 2022-02-21 DIAGNOSIS — D72810 Lymphocytopenia: Secondary | ICD-10-CM | POA: Diagnosis not present

## 2022-02-21 DIAGNOSIS — I10 Essential (primary) hypertension: Secondary | ICD-10-CM | POA: Insufficient documentation

## 2022-02-21 DIAGNOSIS — D472 Monoclonal gammopathy: Secondary | ICD-10-CM | POA: Insufficient documentation

## 2022-02-21 NOTE — Telephone Encounter (Signed)
Attempted to contact pt again. No answer. VM left

## 2022-02-23 ENCOUNTER — Other Ambulatory Visit: Payer: Self-pay

## 2022-02-23 DIAGNOSIS — D539 Nutritional anemia, unspecified: Secondary | ICD-10-CM

## 2022-02-26 LAB — IFE+PROTEIN ELECTRO, 24-HR UR
% BETA, Urine: 19 %
ALPHA 1 URINE: 5.3 %
Albumin, U: 21.5 %
Alpha 2, Urine: 13.3 %
GAMMA GLOBULIN URINE: 40.9 %
M-SPIKE %, Urine: 20.3 % — ABNORMAL HIGH
M-Spike, Mg/24 Hr: 63 mg/24 hr — ABNORMAL HIGH
Total Protein, Urine-Ur/day: 312 mg/24 hr — ABNORMAL HIGH (ref 30–150)
Total Protein, Urine: 16.4 mg/dL
Total Volume: 1900

## 2022-02-28 DIAGNOSIS — G459 Transient cerebral ischemic attack, unspecified: Secondary | ICD-10-CM | POA: Diagnosis not present

## 2022-02-28 DIAGNOSIS — R4182 Altered mental status, unspecified: Secondary | ICD-10-CM | POA: Diagnosis not present

## 2022-02-28 DIAGNOSIS — J90 Pleural effusion, not elsewhere classified: Secondary | ICD-10-CM | POA: Diagnosis not present

## 2022-02-28 DIAGNOSIS — R55 Syncope and collapse: Secondary | ICD-10-CM | POA: Diagnosis not present

## 2022-02-28 DIAGNOSIS — I517 Cardiomegaly: Secondary | ICD-10-CM | POA: Diagnosis not present

## 2022-02-28 DIAGNOSIS — N400 Enlarged prostate without lower urinary tract symptoms: Secondary | ICD-10-CM | POA: Diagnosis not present

## 2022-02-28 DIAGNOSIS — G309 Alzheimer's disease, unspecified: Secondary | ICD-10-CM | POA: Diagnosis not present

## 2022-02-28 DIAGNOSIS — R930 Abnormal findings on diagnostic imaging of skull and head, not elsewhere classified: Secondary | ICD-10-CM | POA: Diagnosis not present

## 2022-02-28 DIAGNOSIS — R4020 Unspecified coma: Secondary | ICD-10-CM | POA: Diagnosis not present

## 2022-02-28 DIAGNOSIS — I6782 Cerebral ischemia: Secondary | ICD-10-CM | POA: Diagnosis not present

## 2022-02-28 DIAGNOSIS — Z79899 Other long term (current) drug therapy: Secondary | ICD-10-CM | POA: Diagnosis not present

## 2022-02-28 DIAGNOSIS — F0280 Dementia in other diseases classified elsewhere without behavioral disturbance: Secondary | ICD-10-CM | POA: Diagnosis not present

## 2022-03-01 DIAGNOSIS — K5909 Other constipation: Secondary | ICD-10-CM | POA: Diagnosis not present

## 2022-03-01 DIAGNOSIS — K640 First degree hemorrhoids: Secondary | ICD-10-CM | POA: Diagnosis not present

## 2022-03-01 DIAGNOSIS — R55 Syncope and collapse: Secondary | ICD-10-CM | POA: Diagnosis not present

## 2022-03-02 DIAGNOSIS — R251 Tremor, unspecified: Secondary | ICD-10-CM | POA: Diagnosis not present

## 2022-03-02 DIAGNOSIS — R413 Other amnesia: Secondary | ICD-10-CM | POA: Diagnosis not present

## 2022-03-07 ENCOUNTER — Inpatient Hospital Stay: Payer: Medicare HMO | Admitting: Oncology

## 2022-03-09 ENCOUNTER — Ambulatory Visit
Admission: RE | Admit: 2022-03-09 | Discharge: 2022-03-09 | Disposition: A | Discharge status: Home / Self Care | Payer: Medicare HMO | Source: Ambulatory Visit | Attending: Oncology | Admitting: Oncology

## 2022-03-09 ENCOUNTER — Ambulatory Visit
Admission: RE | Admit: 2022-03-09 | Discharge: 2022-03-09 | Disposition: A | Discharge status: Home / Self Care | Payer: Medicare HMO | Attending: Oncology | Admitting: Oncology

## 2022-03-09 ENCOUNTER — Encounter: Payer: Self-pay | Admitting: Oncology

## 2022-03-09 ENCOUNTER — Inpatient Hospital Stay: Payer: Medicare HMO

## 2022-03-09 ENCOUNTER — Inpatient Hospital Stay: Payer: Medicare HMO | Attending: Oncology | Admitting: Oncology

## 2022-03-09 ENCOUNTER — Telehealth: Payer: Self-pay

## 2022-03-09 VITALS — BP 113/69 | HR 77 | Temp 97.4°F | Wt 137.2 lb

## 2022-03-09 DIAGNOSIS — I10 Essential (primary) hypertension: Secondary | ICD-10-CM | POA: Insufficient documentation

## 2022-03-09 DIAGNOSIS — R634 Abnormal weight loss: Secondary | ICD-10-CM | POA: Insufficient documentation

## 2022-03-09 DIAGNOSIS — D472 Monoclonal gammopathy: Secondary | ICD-10-CM | POA: Diagnosis not present

## 2022-03-09 DIAGNOSIS — D72810 Lymphocytopenia: Secondary | ICD-10-CM | POA: Insufficient documentation

## 2022-03-09 NOTE — Telephone Encounter (Signed)
Attempted to reach pt multiple times, also tried friend [place], but no answer. This was to offer bone marrow dates. I did leave VM with details and IR ph number so he can call and set up the bx. Will be on the lookout for procedure date to set up MD follow up.

## 2022-03-09 NOTE — Assessment & Plan Note (Signed)
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  Peripheral flowcytometry negative  Pending above work up

## 2022-03-09 NOTE — Telephone Encounter (Signed)
Request for biopsy faxed to IR.

## 2022-03-09 NOTE — Assessment & Plan Note (Signed)
Unknown etiology.  Pending above workup.

## 2022-03-09 NOTE — Assessment & Plan Note (Addendum)
Labs are reviewed and discussed with patient. M protein negative, free lamda 168, light chain ratio 0.13  Bence Jones Protein positive; lambda type, consistent with light monoclonal gammopathy.  Recommend skeletal survey, bone marrow biopsy -with congo red staining.  Check NT pro BNP

## 2022-03-09 NOTE — Telephone Encounter (Signed)
Per [place] on 1/19:   Bone marrow biopsy asap - add comment to do congo red staining.  Follow up MD 2 weeks after BM

## 2022-03-09 NOTE — Progress Notes (Signed)
Hematology/Oncology Consult Note [phone] [phone]      Patient Care Team: Patient, No Pcp Per as PCP - General (General Practice)  CHIEF COMPLAINTS/REASON FOR VISIT:  Monoclonal gammopathy  ASSESSMENT & PLAN:   Monoclonal gammopathy Labs are reviewed and discussed with patient. M protein negative, free lamda 168, light chain ratio 0.13  Bence Jones Protein positive; lambda type, consistent with light monoclonal gammopathy.  Recommend skeletal survey, bone marrow biopsy -with congo red staining.  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  Peripheral flowcytometry negative  Pending above work up  Weight loss Unknown etiology.  Pending above workup.   Orders Placed This Encounter  Procedures   CT BONE MARROW BIOPSY & ASPIRATION    Standing Status:   Future    Standing Expiration Date:   03/10/2023    Order Specific Question:   Reason for Exam (SYMPTOM  OR DIAGNOSIS REQUIRED)    Answer:   MGUS; please add congo red staining    Order Specific Question:   Preferred location?    Answer:   [facility]    Order Specific Question:   Radiology Contrast Protocol - do NOT remove file path    Answer:   [email or web address]   DG Bone Survey Met    Standing Status:   Future    Number of Occurrences:   1    Standing Expiration Date:   03/10/2023    Order Specific Question:   Reason for Exam (SYMPTOM  OR DIAGNOSIS REQUIRED)    Answer:   MGUS    Order Specific Question:   Preferred imaging location?    Answer:   [facility]   DG Bone Survey Met    Standing Status:   Future    Number of Occurrences:   1    Standing Expiration Date:   03/10/2023    Order Specific Question:   Reason for Exam (SYMPTOM  OR DIAGNOSIS REQUIRED)    Answer:   MGUS    Order Specific Question:   Preferred imaging location?    Answer:   [place] test (send-out)    Standing  Status:   Future    Number of Occurrences:   1    Standing Expiration Date:   03/10/2023    Order Specific Question:   Test name / description:    Answer:   NT-proBNP [facility] 143000   # Patient follow-up 2 weeks after bone marrow biopsy All questions were answered. The patient knows to call the clinic with any problems, questions or concerns.  Earlie Server, MD, PhD [facility] Hematology Oncology 03/09/2022     HISTORY OF PRESENTING ILLNESS:   Patient had blood work done on 09/20/2021, CBC showed white count of 3.7, differential shows lymphocytopenia.  Hemoglobin 13.4, MCV 104.6.  Vitamin B12 265.  Patient received 4 doses of vitamin B12 injections 02/02/2022 repeat B12 level >1500.  TSH was normal.  CBC shows persistent leukopenia, predominantly lymphocytopenia.  Persistent mild decrease of hemoglobin of 13.3, MCV 106. Patient denies any routine alcohol use.  Denies any recent infection He has lost weight, February 2023, he weighed 159 pounds.  Currently he weighs 140 pounds.  Appetite is fair and wife reports patient is eating his usual portion of food.  Denies night sweats or fever. Denies any over-the-counter supplement intake.   INTERVAL HISTORY Brendan Carter is a 77 y.o. male who has above history reviewed by me today presents for follow  up visit to discuss work up results.  No new complaints.   MEDICAL HISTORY:  Past Medical History:  Diagnosis Date   BPH (benign prostatic hyperplasia)    Elevated PSA    Hydrocele    Hypertension    Inguinal hernia    left   Prostatitis    Weak urinary stream     SURGICAL HISTORY: Past Surgical History:  Procedure Laterality Date   HERNIA REPAIR Right    hydrocelectomy Right 2013    SOCIAL HISTORY: Social History   Socioeconomic History   Marital status: Single    Spouse name: Not on file   Number of children: Not on file   Years of education: Not on file   Highest education level: Not on file  Occupational History   Not on  file  Tobacco Use   Smoking status: Never   Smokeless tobacco: Never  Substance and Sexual Activity   Alcohol use: No    Alcohol/week: 0.0 standard drinks of alcohol   Drug use: No   Sexual activity: Not on file  Other Topics Concern   Not on file  Social History Narrative   Not on file   Social Determinants of Health   Financial Resource Strain: Not on file  Food Insecurity: Not on file  Transportation Needs: Not on file  Physical Activity: Not on file  Stress: Not on file  Social Connections: Not on file  Intimate Partner Violence: Not on file    FAMILY HISTORY: Family History  Problem Relation Age of Onset   Kidney cancer Neg Hx    Kidney disease Neg Hx    Prostate cancer Neg Hx     ALLERGIES:  has No Known Allergies.  MEDICATIONS:  Current Outpatient Medications  Medication Sig Dispense Refill   finasteride (PROSCAR) 5 MG tablet Take 1 tablet (5 mg total) by mouth daily. 90 tablet 3   mirtazapine (REMERON) 30 MG tablet Take by mouth.     No current facility-administered medications for this visit.    Review of Systems  Constitutional:  Positive for unexpected weight change. Negative for appetite change, chills, fatigue and fever.  HENT:   Negative for hearing loss and voice change.   Eyes:  Negative for eye problems and icterus.  Respiratory:  Negative for chest tightness, cough and shortness of breath.   Cardiovascular:  Negative for chest pain and leg swelling.  Gastrointestinal:  Negative for abdominal distention and abdominal pain.  Endocrine: Negative for hot flashes.  Genitourinary:  Negative for difficulty urinating, dysuria and frequency.   Musculoskeletal:  Negative for arthralgias.  Skin:  Negative for itching and rash.  Neurological:  Negative for light-headedness and numbness.  Hematological:  Negative for adenopathy. Does not bruise/bleed easily.  Psychiatric/Behavioral:  Negative for confusion.     PHYSICAL EXAMINATION: ECOG PERFORMANCE  STATUS: 0 - Asymptomatic Vitals:   03/09/22 1157  BP: 113/69  Pulse: 77  Temp: (!) 97.4 F (36.3 C)  SpO2: 100%   Filed Weights   03/09/22 1157  Weight: 137 lb 3.2 oz (62.2 kg)    Physical Exam Constitutional:      General: He is not in acute distress. HENT:     Head: Normocephalic.  Eyes:     General: No scleral icterus. Cardiovascular:     Rate and Rhythm: Normal rate.  Pulmonary:     Effort: Pulmonary effort is normal. No respiratory distress.  Abdominal:     General: Bowel sounds are normal. There is no  distension.     Palpations: Abdomen is soft.  Musculoskeletal:        General: No deformity. Normal range of motion.     Cervical back: Normal range of motion and neck supple.  Skin:    Findings: No rash.  Neurological:     Mental Status: He is alert and oriented to person, place, and time. Mental status is at baseline.     Cranial Nerves: No cranial nerve deficit.  Psychiatric:        Mood and Affect: Mood normal.      LABORATORY DATA:  I have reviewed the data as listed     Latest Ref Rng & Units 02/13/2022   11:15 AM 01/13/2015    6:27 AM 01/12/2015    9:03 PM  CBC  WBC 4.0 - 10.5 K/uL 3.8  18.8  19.3   Hemoglobin 13.0 - 17.0 g/dL 12.5  12.0  13.1   Hematocrit 39.0 - 52.0 % 38.1  35.6  39.4   Platelets 150 - 400 K/uL 164  145  155       Latest Ref Rng & Units 02/13/2022   11:15 AM 05/11/2019   11:57 AM 01/14/2015    7:06 AM  CMP  Glucose 70 - 99 mg/dL 94   112   BUN 8 - 23 mg/dL 23   17   Creatinine 0.61 - 1.24 mg/dL 1.20  1.20  1.02   Sodium 135 - 145 mmol/L 143   136   Potassium 3.5 - 5.1 mmol/L 4.0   3.8   Chloride 98 - 111 mmol/L 110   106   CO2 22 - 32 mmol/L 30   25   Calcium 8.9 - 10.3 mg/dL 8.9   8.1   Total Protein 6.5 - 8.1 g/dL 6.2     Total Bilirubin 0.3 - 1.2 mg/dL 0.5     Alkaline Phos 38 - 126 U/L 81     AST 15 - 41 U/L 23     ALT 0 - 44 U/L 21        RADIOGRAPHIC STUDIES: I have personally reviewed the radiological  images as listed and agreed with the findings in the report. CT HEAD WO CONTRAST (5MM)  Result Date: 01/21/2022 CLINICAL DATA:  Memory loss. EXAM: CT HEAD WITHOUT CONTRAST TECHNIQUE: Contiguous axial images were obtained from the base of the skull through the vertex without intravenous contrast. RADIATION DOSE REDUCTION: This exam was performed according to the departmental dose-optimization program which includes automated exposure control, adjustment of the mA and/or kV according to patient size and/or use of iterative reconstruction technique. COMPARISON:  None Available. FINDINGS: Brain: Ventricles, cisterns and other CSF spaces are normal. There is no mass, mass effect, shift midline structures or acute hemorrhage. No evidence of acute infarction. Very minimal chronic ischemic microvascular disease. Vascular: No hyperdense vessel or unexpected calcification. Skull: Normal. Negative for fracture or focal lesion. Sinuses/Orbits: No acute finding. Other: None. IMPRESSION: 1. No acute findings. 2. Minimal chronic ischemic microvascular disease. Electronically Signed   By: Marin Olp M.D.   On: 01/21/2022 12:26

## 2022-03-10 LAB — MISC LABCORP TEST (SEND OUT): Labcorp test code: 143000

## 2022-03-12 ENCOUNTER — Other Ambulatory Visit: Payer: Self-pay | Admitting: *Deleted

## 2022-03-12 DIAGNOSIS — G309 Alzheimer's disease, unspecified: Secondary | ICD-10-CM | POA: Diagnosis not present

## 2022-03-12 DIAGNOSIS — F0280 Dementia in other diseases classified elsewhere without behavioral disturbance: Secondary | ICD-10-CM | POA: Diagnosis not present

## 2022-03-12 DIAGNOSIS — R413 Other amnesia: Secondary | ICD-10-CM | POA: Diagnosis not present

## 2022-03-12 DIAGNOSIS — R972 Elevated prostate specific antigen [PSA]: Secondary | ICD-10-CM

## 2022-03-12 DIAGNOSIS — G250 Essential tremor: Secondary | ICD-10-CM | POA: Diagnosis not present

## 2022-03-12 DIAGNOSIS — I679 Cerebrovascular disease, unspecified: Secondary | ICD-10-CM | POA: Diagnosis not present

## 2022-03-12 NOTE — Telephone Encounter (Signed)
Pt has been scheduled for BM biops on 2/9. Marcie Bal have given pt's sig other appt info.   Please schedule MD only approx 2 weeks after 2/ 9 and inform pt/ sig other of appts. Please also mail appt.

## 2022-03-22 ENCOUNTER — Other Ambulatory Visit: Payer: Medicare HMO

## 2022-03-22 DIAGNOSIS — R972 Elevated prostate specific antigen [PSA]: Secondary | ICD-10-CM | POA: Diagnosis not present

## 2022-03-23 LAB — PSA: Prostate Specific Ag, Serum: 14.9 ng/mL — ABNORMAL HIGH (ref 0.0–4.0)

## 2022-03-27 ENCOUNTER — Ambulatory Visit: Payer: Medicare HMO | Admitting: Urology

## 2022-03-27 ENCOUNTER — Encounter: Payer: Self-pay | Admitting: Urology

## 2022-03-27 VITALS — BP 126/76 | HR 73 | Ht 68.0 in | Wt 140.0 lb

## 2022-03-27 DIAGNOSIS — R3912 Poor urinary stream: Secondary | ICD-10-CM | POA: Diagnosis not present

## 2022-03-27 DIAGNOSIS — N401 Enlarged prostate with lower urinary tract symptoms: Secondary | ICD-10-CM | POA: Diagnosis not present

## 2022-03-27 DIAGNOSIS — N400 Enlarged prostate without lower urinary tract symptoms: Secondary | ICD-10-CM

## 2022-03-27 LAB — BLADDER SCAN AMB NON-IMAGING: Scan Result: 2

## 2022-03-27 MED ORDER — FINASTERIDE 5 MG PO TABS: 5.0000 mg | ORAL_TABLET | Freq: Every day | ORAL | 3 refills | Status: DC

## 2022-03-27 NOTE — Progress Notes (Signed)
I, DeAsia L Maxie,acting as a scribe for Hollice Espy, MD.,have documented all relevant documentation on the behalf of Hollice Espy, MD,as directed by  Hollice Espy, MD while in the presence of Hollice Espy, MD.   I, Earna Coder as a scribe for Hollice Espy, MD.,have documented all relevant documentation on the behalf of Hollice Espy, MD,as directed by  Hollice Espy, MD while in the presence of Hollice Espy, MD.   03/27/22 11:28 AM   Brendan Carter 1945-10-07 [id number]  Chief Complaint  Patient presents with   Follow-up    1 year follow-up    Benign Prostatic Hypertrophy    HPI: 77 year-old male with a personal history of elevated PSA and BPH with massive prostatomegaly who is here for an annual follow up.   He has undergone multiple negative biopsies in the context of rising PSA levels. He had a prostate MRI in 2021 which showed a PI-RADS 3 lesion at the right posterior transition zone. At that time, his prostate volume was 290.   His most recent PSA was 14.9 on 03/22/22.  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  He reports satisfaction with his current urinary symptoms. He has been on Finasteride. He is accompanied by his wife today who answers most questions.    PSA trend: 11.3 ng/mL on 11/27/2011.     07.8 ng/mL on 08/06/2012 10.5 ng/mL on 02/05/2013 08.2 ng/mL on 08/06/2013 08.7 ng/mL on 02/05/2014 20.6 ng/mL on 02/04/2015 16.4 ng/mL on 02/06/2015 10.7 ng/mL on 05/25/2015  8.44 ng/mL on 11/28/2015 8.1   ng/mL on 05/23/2016 8.5   Ng/mL on 12/03/2016 7.6  ng/mL on 06/05/2017 10.0 ng/mL on 09/23/2018 13.4 ng/mL on 04/07/2019 9.7 ng/mL on 09/14/2019  8.3 ng/mL on 03/18/2020 11.4 ng/ mL on 03/20/2021 14.9 ng/mL on 03/22/2022   Results for orders placed or performed in visit on 03/27/22  BLADDER SCAN AMB NON-IMAGING  Result Value Ref Range   Scan Result 2 ml      IPSS      Row Name 03/27/22 0900         International Prostate Symptom Score   How often have you had the sensation of not emptying your bladder? Not at All     How often have you had to urinate less than every two hours? Less than half the time     How often have you found you stopped and started again several times when you urinated? Less than half the time     How often have you found it difficult to postpone urination? Less than 1 in 5 times     How often have you had a weak urinary stream? Less than 1 in 5 times     How often have you had to strain to start urination? Not at All     How many times did you typically get up at night to urinate? None     Total IPSS Score 6              Score:  1-7 Mild 8-19 Moderate 20-35 Severe   PMH: Past Medical History:  Diagnosis Date   BPH (benign prostatic hyperplasia)    Elevated PSA    Hydrocele    Hypertension    Inguinal hernia    left   Prostatitis    Weak urinary stream     Surgical History: Past Surgical History:  Procedure Laterality Date   HERNIA  REPAIR Right    hydrocelectomy Right 2013    Home Medications:  Allergies as of 03/27/2022   No Known Allergies      Medication List        Accurate as of March 27, 2022 11:28 AM. If you have any questions, ask your nurse or doctor.          finasteride 5 MG tablet Commonly known as: PROSCAR Take 1 tablet (5 mg total) by mouth daily.   mirtazapine 30 MG tablet Commonly known as: REMERON Take by mouth.       Family History: Family History  Problem Relation Age of Onset   Kidney cancer Neg Hx    Kidney disease Neg Hx    Prostate cancer Neg Hx    Social History:  reports that he has never smoked. He has never been exposed to tobacco smoke. He has never used smokeless tobacco. He reports that he does not drink alcohol and does not use drugs.  Physical Exam: BP 126/76   Pulse 73   Ht '5\' 8"'$  (1.727 m)   Wt 140 lb (63.5 kg)   BMI 21.29 kg/m    Constitutional:  Alert and oriented, No acute distress. HEENT: [place] AT, moist mucus membranes. Trachea midline, no masses. Rectal exam: Significant prostate enlargement noted, no new lumps or bumps identified. Neurologic: Grossly intact, no focal deficits, moving all 4 extremities. Psychiatric: Normal mood and affect.  Assessment and Plan:   1. BPH/ elevated PSA - Continue management with finasteride. His urinary symptoms are currently well-managed.  -Given the stable nature of his PSA levels and the absence of new findings on DRE,  plan to maintain the current treatment regimen and continue annual monitoring of PSA levels especially in light or age and co morbidities  Follow-up in 1 year with DRE, PVR, IPSS, and PSA  I have reviewed the above documentation for accuracy and completeness, and I agree with the above.   Hollice Espy, MD   [facility] [place] [place],  [id number] [phone]

## 2022-03-29 ENCOUNTER — Other Ambulatory Visit: Payer: Self-pay | Admitting: Radiology

## 2022-03-29 DIAGNOSIS — Z01812 Encounter for preprocedural laboratory examination: Secondary | ICD-10-CM

## 2022-03-29 NOTE — H&P (Signed)
Chief Complaint: Patient was seen in consultation today for monoclonal gammopathy of unknown significance (MGUS) requiring image-guided bone marrow biopsy  Referring Physician(s): Yu,Zhou  Supervising Physician: Mir, Sharen Heck  Patient Status: [facility] - Out-pt  History of Present Illness: Brendan Carter is a 77 y.o. male with PMH significant for hypertension being seen today in relation to MGUS. He has been experiencing weight loss, lymphocytopenia, and monoclonal gammopathy, along with reported weight loss and memory issues. The patient is being followed by Dr Tasia Catchings from Oncology who has referred the patient to IR for image-guided bone marrow biopsy.   Past Medical History:  Diagnosis Date   BPH (benign prostatic hyperplasia)    Elevated PSA    Hydrocele    Hypertension    Inguinal hernia    left   Prostatitis    Weak urinary stream     Past Surgical History:  Procedure Laterality Date   HERNIA REPAIR Right    hydrocelectomy Right 2013    Allergies: Patient has no known allergies.  Medications: Prior to Admission medications   Medication Sig Start Date End Date Taking? Authorizing Provider  finasteride (PROSCAR) 5 MG tablet Take 1 tablet (5 mg total) by mouth daily. 03/27/22   Hollice Espy, MD  mirtazapine (REMERON) 30 MG tablet Take by mouth. 02/02/22 02/02/23  [provider]     Family History  Problem Relation Age of Onset   Kidney cancer Neg Hx    Kidney disease Neg Hx    Prostate cancer Neg Hx     Social History   Socioeconomic History   Marital status: Single    Spouse name: Not on file   Number of children: Not on file   Years of education: Not on file   Highest education level: Not on file  Occupational History   Not on file  Tobacco Use   Smoking status: Never    Passive exposure: Never   Smokeless tobacco: Never  Substance and Sexual Activity   Alcohol use: No    Alcohol/week: 0.0 standard drinks of alcohol   Drug use: No   Sexual  activity: Not on file  Other Topics Concern   Not on file  Social History Narrative   Not on file   Social Determinants of Health   Financial Resource Strain: Not on file  Food Insecurity: Not on file  Transportation Needs: Not on file  Physical Activity: Not on file  Stress: Not on file  Social Connections: Not on file    Code Status: Full Code  Review of Systems: A 12 point ROS discussed and pertinent positives are indicated in the HPI above.  All other systems are negative.  Review of Systems  Constitutional:  Negative for chills and fever.  Respiratory:  Negative for chest tightness and shortness of breath.   Cardiovascular:  Positive for leg swelling. Negative for chest pain.  Gastrointestinal:  Negative for abdominal pain, diarrhea, nausea and vomiting.  Neurological:  Negative for dizziness and headaches.  Psychiatric/Behavioral:  Positive for confusion.     Vital Signs: BP 130/88   Pulse 70   Temp 97.9 F (36.6 C) (Oral)   Resp (!) 26   SpO2 100%    Physical Exam Vitals reviewed.  Constitutional:      General: He is not in acute distress. HENT:     Mouth/Throat:     Mouth: Mucous membranes are moist.  Eyes:     Pupils: Pupils are equal, round, and reactive to light.  Cardiovascular:     Rate and Rhythm: Normal rate and regular rhythm.     Pulses: Normal pulses.     Heart sounds: Normal heart sounds.  Pulmonary:     Effort: Pulmonary effort is normal.     Breath sounds: Normal breath sounds.  Abdominal:     Palpations: Abdomen is soft.     Tenderness: There is no abdominal tenderness.  Musculoskeletal:     Right lower leg: Edema present.     Left lower leg: Edema present.  Skin:    General: Skin is warm and dry.  Neurological:     Mental Status: He is alert and oriented to person, place, and time.     Comments: Oriented x3, but with great effort  Psychiatric:        Mood and Affect: Mood normal.        Behavior: Behavior normal.      Imaging: DG Bone Survey Met  Result Date: 03/12/2022 CLINICAL DATA:  MGUS EXAM: METASTATIC BONE SURVEY COMPARISON:  None Available. FINDINGS: No focal lytic or blastic bone lesions. Degenerative changes are seen within the cervical spine. Lungs are clear. Cardiac size within normal limits. Normal abdominal gas pattern. Right inguinal hernia repair with mesh has been performed. IMPRESSION: 1. No focal lytic or blastic bone lesions. Electronically Signed   By: Fidela Salisbury M.D.   On: 03/12/2022 02:50    Labs:  CBC: Recent Labs    02/13/22 1115  WBC 3.8*  HGB 12.5*  HCT 38.1*  PLT 164    COAGS: No results for input(s): "INR", "APTT" in the last 8760 hours.  BMP: Recent Labs    02/13/22 1115  NA 143  K 4.0  CL 110  CO2 30  GLUCOSE 94  BUN 23  CALCIUM 8.9  CREATININE 1.20  GFRNONAA >60    LIVER FUNCTION TESTS: Recent Labs    02/13/22 1115  BILITOT 0.5  AST 23  ALT 21  ALKPHOS 81  PROT 6.2*  ALBUMIN 4.0    TUMOR MARKERS: No results for input(s): "AFPTM", "CEA", "CA199", "CHROMGRNA" in the last 8760 hours.  Assessment and Plan:  Brendan Carter is a 77 yo male with monoclonal gammopathy of unknown significance being seen today for image-guided bone marrow biopsy. The patient presents today in their usual state of health and is accompanied by his wife. The case has been reviewed with Dr Dwaine Gale and is scheduled to proceed on 03/30/22.  Risks and benefits of image-guided bone marrow biopsy was discussed with the patient and/or patient's family including, but not limited to bleeding, infection, damage to adjacent structures or low yield requiring additional tests.  All of the questions were answered and there is agreement to proceed.  Consent signed and in chart.    Thank you for this interesting consult.  I greatly enjoyed meeting CLAYBORN HEYSE and look forward to participating in their care.  A copy of this report was sent to the requesting provider on this  date.  Electronically Signed: Lura Em, PA-C 03/29/2022, 4:06 PM   I spent a total of  40 Minutes   in face to face in clinical consultation, greater than 50% of which was counseling/coordinating care for monoclonal gammopathy of unknown significance.

## 2022-03-29 NOTE — Progress Notes (Signed)
Patient for Bone Marrow Biopsy on Fri 03/30/2022, I called and spoke with the patient on the  phone and gave pre-procedure instructions. Pt was made aware to be here at 7:30a at the new entrance, NPO after MN prior to procedure as well as driver post procedure/recovery/discharge. Pt stated understanding.  Called 03/29/2022

## 2022-03-30 ENCOUNTER — Other Ambulatory Visit: Payer: Self-pay

## 2022-03-30 ENCOUNTER — Ambulatory Visit
Admission: RE | Admit: 2022-03-30 | Discharge: 2022-03-30 | Disposition: A | Discharge status: Home / Self Care | Payer: Medicare HMO | Source: Ambulatory Visit | Attending: Oncology | Admitting: Oncology

## 2022-03-30 DIAGNOSIS — D72822 Plasmacytosis: Secondary | ICD-10-CM | POA: Insufficient documentation

## 2022-03-30 DIAGNOSIS — D61818 Other pancytopenia: Secondary | ICD-10-CM | POA: Insufficient documentation

## 2022-03-30 DIAGNOSIS — R634 Abnormal weight loss: Secondary | ICD-10-CM | POA: Diagnosis not present

## 2022-03-30 DIAGNOSIS — D472 Monoclonal gammopathy: Secondary | ICD-10-CM

## 2022-03-30 DIAGNOSIS — Z01812 Encounter for preprocedural laboratory examination: Secondary | ICD-10-CM

## 2022-03-30 DIAGNOSIS — Z1379 Encounter for other screening for genetic and chromosomal anomalies: Secondary | ICD-10-CM | POA: Diagnosis not present

## 2022-03-30 LAB — CBC WITH DIFFERENTIAL/PLATELET
Abs Immature Granulocytes: 0.01 10*3/uL (ref 0.00–0.07)
Basophils Absolute: 0 10*3/uL (ref 0.0–0.1)
Basophils Relative: 1 %
Eosinophils Absolute: 0.1 10*3/uL (ref 0.0–0.5)
Eosinophils Relative: 3 %
HCT: 37.4 % — ABNORMAL LOW (ref 39.0–52.0)
Hemoglobin: 12.2 g/dL — ABNORMAL LOW (ref 13.0–17.0)
Immature Granulocytes: 0 %
Lymphocytes Relative: 25 %
Lymphs Abs: 0.9 10*3/uL (ref 0.7–4.0)
MCH: 34 pg (ref 26.0–34.0)
MCHC: 32.6 g/dL (ref 30.0–36.0)
MCV: 104.2 fL — ABNORMAL HIGH (ref 80.0–100.0)
Monocytes Absolute: 0.3 10*3/uL (ref 0.1–1.0)
Monocytes Relative: 8 %
Neutro Abs: 2.3 10*3/uL (ref 1.7–7.7)
Neutrophils Relative %: 63 %
Platelets: 143 10*3/uL — ABNORMAL LOW (ref 150–400)
RBC: 3.59 MIL/uL — ABNORMAL LOW (ref 4.22–5.81)
RDW: 12.8 % (ref 11.5–15.5)
WBC: 3.5 10*3/uL — ABNORMAL LOW (ref 4.0–10.5)
nRBC: 0 % (ref 0.0–0.2)

## 2022-03-30 MED ORDER — MIDAZOLAM HCL 2 MG/2ML IJ SOLN
INTRAMUSCULAR | Status: AC
  Filled 2022-03-30: qty 2

## 2022-03-30 MED ORDER — LIDOCAINE HCL 1 % IJ SOLN
INTRAMUSCULAR | Status: AC | PRN
  Administered 2022-03-30: 4 mL

## 2022-03-30 MED ORDER — FENTANYL CITRATE (PF) 100 MCG/2ML IJ SOLN
INTRAMUSCULAR | Status: AC
  Filled 2022-03-30: qty 2

## 2022-03-30 MED ORDER — HEPARIN SOD (PORK) LOCK FLUSH 100 UNIT/ML IV SOLN
INTRAVENOUS | Status: AC
  Filled 2022-03-30: qty 5

## 2022-03-30 MED ORDER — MIDAZOLAM HCL 5 MG/5ML IJ SOLN
INTRAMUSCULAR | Status: AC | PRN
  Administered 2022-03-30: .5 mg via INTRAVENOUS

## 2022-03-30 MED ORDER — SODIUM CHLORIDE 0.9 % IV SOLN: INTRAVENOUS | Status: DC

## 2022-03-30 MED ORDER — FENTANYL CITRATE (PF) 100 MCG/2ML IJ SOLN
INTRAMUSCULAR | Status: AC | PRN
  Administered 2022-03-30: 50 ug via INTRAVENOUS

## 2022-03-30 NOTE — Progress Notes (Signed)
Patient clinically stable post BMB per Dr Dwaine Gale, tolerated well. Vitals stable pre and post procedure. Denies complaints at present. Received Versed 0.5 mg along with Fentanyl 50 mcg IV for procedure. Report given to [facility] post procedure/5.

## 2022-03-30 NOTE — Procedures (Signed)
Interventional Radiology Procedure Note  Indication: Monoclonal gammopathy   Procedure: CT guided aspirate and core biopsy of right iliac bone  Complications: None  Bleeding: Minimal  Miachel Roux, MD [phone]

## 2022-04-03 LAB — SURGICAL PATHOLOGY

## 2022-04-10 ENCOUNTER — Encounter (HOSPITAL_COMMUNITY): Payer: Self-pay | Admitting: Oncology

## 2022-04-12 ENCOUNTER — Encounter (HOSPITAL_COMMUNITY): Payer: Self-pay | Admitting: Oncology

## 2022-04-13 ENCOUNTER — Encounter: Payer: Self-pay | Admitting: Oncology

## 2022-04-13 ENCOUNTER — Inpatient Hospital Stay: Payer: Medicare HMO | Attending: Oncology | Admitting: Oncology

## 2022-04-13 VITALS — BP 107/53 | HR 96 | Temp 96.4°F | Resp 18 | Wt 143.8 lb

## 2022-04-13 DIAGNOSIS — D72810 Lymphocytopenia: Secondary | ICD-10-CM | POA: Diagnosis not present

## 2022-04-13 DIAGNOSIS — D472 Monoclonal gammopathy: Secondary | ICD-10-CM | POA: Insufficient documentation

## 2022-04-13 DIAGNOSIS — Z7189 Other specified counseling: Secondary | ICD-10-CM | POA: Insufficient documentation

## 2022-04-13 DIAGNOSIS — D539 Nutritional anemia, unspecified: Secondary | ICD-10-CM | POA: Insufficient documentation

## 2022-04-13 DIAGNOSIS — I10 Essential (primary) hypertension: Secondary | ICD-10-CM | POA: Diagnosis not present

## 2022-04-13 NOTE — Assessment & Plan Note (Signed)
Discussed with patient

## 2022-04-13 NOTE — Progress Notes (Signed)
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  Bone marrow biopsy showed 8% plasma cells, normal cytogenetics, negative congo red staining.  T (11;14)  I discussed with patient about the diagnosis of light chian MGUS which is an asymptomatic condition which may progress to smoldering multiple myeloma and to symptomatic multiple myeloma. Less frequently, these patients progress to AL amyloidosis, light chain deposition disease, or another lymphoproliferative disorder. Negative skeletal survey,  For now I recommend observation.     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  Peripheral flowcytometry negative  Monitor   Goals of care, counseling/discussion Discussed with patient.   Anemia, macrocytic Normal vitamin B12 level and folate level. Due to monoclonal gammopathy Monitor    Orders Placed This Encounter  Procedures   IFE+PROTEIN ELECTRO, 24-HR UR    Standing Status:   Future    Standing Expiration Date:   04/14/2023   CBC with Differential (Cancer Center Only)    Standing Status:   Future    Standing Expiration Date:   04/14/2023   CMP ([facility] only)    Standing Status:   Future    Standing Expiration Date:   04/14/2023   Kappa/lambda light chains    Standing Status:   Future    Standing Expiration Date:   04/13/2023   Multiple Myeloma Panel (SPEP&IFE w/QIG)    Standing Status:   Future    Standing Expiration Date:   04/13/2023   IFE+PROTEIN ELECTRO, 24-HR UR    Standing Status:   Future    Standing Expiration Date:   04/14/2023   # follow-up 6 months.  All  questions were answered. The patient knows to call the clinic with any problems, questions or concerns.  Earlie Server, MD, PhD [facility]ematology Oncology 04/13/2022     HISTORY OF PRESENTING ILLNESS:   Patient had blood work done on 09/20/2021, CBC showed white count of 3.7, differential shows lymphocytopenia.  Hemoglobin 13.4, MCV 104.6.  Vitamin B12 265.  Patient received 4 doses of vitamin B12 injections 02/02/2022 repeat B12 level >1500.  TSH was normal.  CBC shows persistent leukopenia, predominantly lymphocytopenia.  Persistent mild decrease of hemoglobin of 13.3, MCV 106. Patient denies any routine alcohol use.  Denies any recent infection He has lost weight, February 2023, he weighed 159 pounds.  Currently he weighs 140 pounds.  Appetite is fair and wife reports patient is eating his usual portion of food.  Denies night sweats or fever. Denies any over-the-counter supplement intake.   INTERVAL HISTORY DESEAN Carter is a 77 y.o. male who has above history reviewed by me today presents for follow up visit to discuss bone marrow biopsy results.  No new complaints.   MEDICAL HISTORY:  Past Medical History:  Diagnosis Date   BPH (benign prostatic hyperplasia)    Elevated PSA    Hydrocele    Hypertension    Inguinal hernia    left   Prostatitis    Weak urinary stream     SURGICAL HISTORY: Past Surgical History:  Procedure Laterality Date   HERNIA REPAIR Right    hydrocelectomy Right 2013  SOCIAL HISTORY: Social History   Socioeconomic History   Marital status: Single    Spouse name: Not on file   Number of children: Not on file   Years of education: Not on file   Highest education level: Not on file  Occupational History   Not on file  Tobacco Use   Smoking status: Never    Passive exposure: Never   Smokeless tobacco: Never  Substance and Sexual Activity   Alcohol use: No    Alcohol/week: 0.0 standard drinks of alcohol   Drug use: No   Sexual activity:  Not on file  Other Topics Concern   Not on file  Social History Narrative   Not on file   Social Determinants of Health   Financial Resource Strain: Not on file  Food Insecurity: Not on file  Transportation Needs: Not on file  Physical Activity: Not on file  Stress: Not on file  Social Connections: Not on file  Intimate Partner Violence: Not on file    FAMILY HISTORY: Family History  Problem Relation Age of Onset   Kidney cancer Neg Hx    Kidney disease Neg Hx    Prostate cancer Neg Hx     ALLERGIES:  has No Known Allergies.  MEDICATIONS:  Current Outpatient Medications  Medication Sig Dispense Refill   finasteride (PROSCAR) 5 MG tablet Take 1 tablet (5 mg total) by mouth daily. 90 tablet 3   mirtazapine (REMERON) 30 MG tablet Take by mouth.     No current facility-administered medications for this visit.    Review of Systems  Constitutional:  Negative for appetite change, chills, fatigue, fever and unexpected weight change.  HENT:   Negative for hearing loss and voice change.   Eyes:  Negative for eye problems and icterus.  Respiratory:  Negative for chest tightness, cough and shortness of breath.   Cardiovascular:  Negative for chest pain and leg swelling.  Gastrointestinal:  Negative for abdominal distention and abdominal pain.  Endocrine: Negative for hot flashes.  Genitourinary:  Negative for difficulty urinating, dysuria and frequency.   Musculoskeletal:  Negative for arthralgias.  Skin:  Negative for itching and rash.  Neurological:  Negative for light-headedness and numbness.  Hematological:  Negative for adenopathy. Does not bruise/bleed easily.  Psychiatric/Behavioral:  Negative for confusion.     PHYSICAL EXAMINATION: ECOG PERFORMANCE STATUS: 0 - Asymptomatic Vitals:   04/13/22 1002  BP: (!) 107/53  Pulse: 96  Resp: 18  Temp: (!) 96.4 F (35.8 C)  SpO2: 96%   Filed Weights   04/13/22 1002  Weight: 143 lb 12.8 oz (65.2 kg)    Physical  Exam Constitutional:      General: He is not in acute distress. HENT:     Head: Normocephalic.  Eyes:     General: No scleral icterus. Cardiovascular:     Rate and Rhythm: Normal rate.  Pulmonary:     Effort: Pulmonary effort is normal. No respiratory distress.  Abdominal:     General: Bowel sounds are normal. There is no distension.     Palpations: Abdomen is soft.  Musculoskeletal:        General: No deformity. Normal range of motion.     Cervical back: Normal range of motion and neck supple.  Skin:    Findings: No rash.  Neurological:     Mental Status: He is alert and oriented to person, place, and time. Mental status is at baseline.     Cranial Nerves: No cranial nerve  deficit.  Psychiatric:        Mood and Affect: Mood normal.      LABORATORY DATA:  I have reviewed the data as listed     Latest Ref Rng & Units 03/30/2022    7:57 AM 02/13/2022   11:15 AM 01/13/2015    6:27 AM  CBC  WBC 4.0 - 10.5 K/uL 3.5  3.8  18.8   Hemoglobin 13.0 - 17.0 g/dL 12.2  12.5  12.0   Hematocrit 39.0 - 52.0 % 37.4  38.1  35.6   Platelets 150 - 400 K/uL 143  164  145       Latest Ref Rng & Units 02/13/2022   11:15 AM 05/11/2019   11:57 AM 01/14/2015    7:06 AM  CMP  Glucose 70 - 99 mg/dL 94   112   BUN 8 - 23 mg/dL 23   17   Creatinine 0.61 - 1.24 mg/dL 1.20  1.20  1.02   Sodium 135 - 145 mmol/L 143   136   Potassium 3.5 - 5.1 mmol/L 4.0   3.8   Chloride 98 - 111 mmol/L 110   106   CO2 22 - 32 mmol/L 30   25   Calcium 8.9 - 10.3 mg/dL 8.9   8.1   Total Protein 6.5 - 8.1 g/dL 6.2     Total Bilirubin 0.3 - 1.2 mg/dL 0.5     Alkaline Phos 38 - 126 U/L 81     AST 15 - 41 U/L 23     ALT 0 - 44 U/L 21        RADIOGRAPHIC STUDIES: I have personally reviewed the radiological images as listed and agreed with the findings in the report. CT BONE MARROW BIOPSY & ASPIRATION  Result Date: 03/30/2022 INDICATION: Monoclonal gammopathy EXAM: CT GUIDED BONE MARROW ASPIRATES AND BIOPSY  MEDICATIONS: None. ANESTHESIA/SEDATION: Fentanyl 50 mcg IV; Versed 0.5 mg IV Moderate Sedation Time:  10 minutes The patient was continuously monitored during the procedure by the interventional radiology nurse under my direct supervision. COMPLICATIONS: None immediate. PROCEDURE: The procedure was explained to the patient. The risks and benefits of the procedure were discussed and the patient's questions were addressed. Informed consent was obtained from the patient. The patient was placed prone on CT table. Images of the pelvis were obtained. The right side of back was prepped and draped in sterile fashion. The skin and right posterior ilium were anesthetized with 1% lidocaine. 11 gauge bone needle was directed into the right ilium with CT guidance. Two aspirates and 1 core biopsy were obtained. Bandage placed over the puncture site. IMPRESSION: CT guided bone marrow aspiration and core biopsy. Electronically Signed   By: Miachel Roux M.D.   On: 03/30/2022 14:32   DG Bone Survey Met  Result Date: 03/12/2022 CLINICAL DATA:  MGUS EXAM: METASTATIC BONE SURVEY COMPARISON:  None Available. FINDINGS: No focal lytic or blastic bone lesions. Degenerative changes are seen within the cervical spine. Lungs are clear. Cardiac size within normal limits. Normal abdominal gas pattern. Right inguinal hernia repair with mesh has been performed. IMPRESSION: 1. No focal lytic or blastic bone lesions. Electronically Signed   By: Fidela Salisbury M.D.   On: 03/12/2022 02:50   CT HEAD WO CONTRAST (5MM)  Result Date: 01/21/2022 CLINICAL DATA:  Memory loss. EXAM: CT HEAD WITHOUT CONTRAST TECHNIQUE: Contiguous axial images were obtained from the base of the skull through the vertex without intravenous contrast. RADIATION DOSE REDUCTION: This  exam was performed according to the departmental dose-optimization program which includes automated exposure control, adjustment of the mA and/or kV according to patient size and/or use of  iterative reconstruction technique. COMPARISON:  None Available. FINDINGS: Brain: Ventricles, cisterns and other CSF spaces are normal. There is no mass, mass effect, shift midline structures or acute hemorrhage. No evidence of acute infarction. Very minimal chronic ischemic microvascular disease. Vascular: No hyperdense vessel or unexpected calcification. Skull: Normal. Negative for fracture or focal lesion. Sinuses/Orbits: No acute finding. Other: None. IMPRESSION: 1. No acute findings. 2. Minimal chronic ischemic microvascular disease. Electronically Signed   By: Marin Olp M.D.   On: 01/21/2022 12:26

## 2022-04-13 NOTE — Assessment & Plan Note (Signed)
Normal vitamin B12 level and folate level. Due to monoclonal gammopathy Monitor

## 2022-04-13 NOTE — Assessment & Plan Note (Addendum)
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  Bone marrow biopsy showed 8% plasma cells, normal cytogenetics, negative congo red staining.  T (11;14)  I discussed with patient about the diagnosis of light chian MGUS which is an asymptomatic condition which may progress to smoldering multiple myeloma and to symptomatic multiple myeloma. Less frequently, these patients progress to AL amyloidosis, light chain deposition disease, or another lymphoproliferative disorder. Negative skeletal survey,  For now I recommend observation.

## 2022-04-13 NOTE — Assessment & Plan Note (Signed)
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  Peripheral flowcytometry negative  Monitor

## 2022-04-26 ENCOUNTER — Inpatient Hospital Stay: Payer: Medicare HMO | Attending: Oncology

## 2022-04-26 ENCOUNTER — Other Ambulatory Visit: Payer: Self-pay

## 2022-04-26 DIAGNOSIS — D472 Monoclonal gammopathy: Secondary | ICD-10-CM | POA: Diagnosis not present

## 2022-04-30 LAB — IFE+PROTEIN ELECTRO, 24-HR UR
% BETA, Urine: 26.3 %
ALPHA 1 URINE: 2.7 %
Albumin, U: 22.2 %
Alpha 2, Urine: 5.2 %
GAMMA GLOBULIN URINE: 43.5 %
M-SPIKE %, Urine: 23.5 % — ABNORMAL HIGH
M-Spike, Mg/24 Hr: 87 mg/24 hr — ABNORMAL HIGH
Total Protein, Urine-Ur/day: 371 mg/24 hr — ABNORMAL HIGH (ref 30–150)
Total Protein, Urine: 25.6 mg/dL
Total Volume: 1450

## 2022-05-04 ENCOUNTER — Other Ambulatory Visit
Admission: RE | Admit: 2022-05-04 | Discharge: 2022-05-04 | Disposition: A | Discharge status: Home / Self Care | Payer: Medicare HMO | Source: Ambulatory Visit | Attending: Infectious Diseases | Admitting: Infectious Diseases

## 2022-05-04 DIAGNOSIS — R600 Localized edema: Secondary | ICD-10-CM | POA: Diagnosis not present

## 2022-05-04 DIAGNOSIS — N400 Enlarged prostate without lower urinary tract symptoms: Secondary | ICD-10-CM | POA: Diagnosis not present

## 2022-05-04 DIAGNOSIS — Z0000 Encounter for general adult medical examination without abnormal findings: Secondary | ICD-10-CM | POA: Diagnosis not present

## 2022-05-04 DIAGNOSIS — E8809 Other disorders of plasma-protein metabolism, not elsewhere classified: Secondary | ICD-10-CM | POA: Diagnosis not present

## 2022-05-04 DIAGNOSIS — R251 Tremor, unspecified: Secondary | ICD-10-CM | POA: Diagnosis not present

## 2022-05-04 DIAGNOSIS — D649 Anemia, unspecified: Secondary | ICD-10-CM | POA: Diagnosis not present

## 2022-05-04 DIAGNOSIS — R413 Other amnesia: Secondary | ICD-10-CM | POA: Diagnosis not present

## 2022-05-04 LAB — BRAIN NATRIURETIC PEPTIDE: B Natriuretic Peptide: 64.9 pg/mL (ref 0.0–100.0)

## 2022-05-11 DIAGNOSIS — I679 Cerebrovascular disease, unspecified: Secondary | ICD-10-CM | POA: Diagnosis not present

## 2022-05-11 DIAGNOSIS — R251 Tremor, unspecified: Secondary | ICD-10-CM | POA: Diagnosis not present

## 2022-05-11 DIAGNOSIS — R413 Other amnesia: Secondary | ICD-10-CM | POA: Diagnosis not present

## 2022-06-01 DIAGNOSIS — R251 Tremor, unspecified: Secondary | ICD-10-CM | POA: Diagnosis not present

## 2022-06-01 DIAGNOSIS — D649 Anemia, unspecified: Secondary | ICD-10-CM | POA: Diagnosis not present

## 2022-06-01 DIAGNOSIS — R413 Other amnesia: Secondary | ICD-10-CM | POA: Diagnosis not present

## 2022-06-14 DIAGNOSIS — R600 Localized edema: Secondary | ICD-10-CM | POA: Diagnosis not present

## 2022-06-15 DIAGNOSIS — R413 Other amnesia: Secondary | ICD-10-CM | POA: Diagnosis not present

## 2022-06-15 DIAGNOSIS — R258 Other abnormal involuntary movements: Secondary | ICD-10-CM | POA: Diagnosis not present

## 2022-06-15 DIAGNOSIS — R251 Tremor, unspecified: Secondary | ICD-10-CM | POA: Diagnosis not present

## 2022-06-25 DIAGNOSIS — R251 Tremor, unspecified: Secondary | ICD-10-CM | POA: Diagnosis not present

## 2022-06-25 DIAGNOSIS — R413 Other amnesia: Secondary | ICD-10-CM | POA: Diagnosis not present

## 2022-06-25 DIAGNOSIS — I679 Cerebrovascular disease, unspecified: Secondary | ICD-10-CM | POA: Diagnosis not present

## 2022-07-20 DIAGNOSIS — D649 Anemia, unspecified: Secondary | ICD-10-CM | POA: Diagnosis not present

## 2022-07-20 DIAGNOSIS — R413 Other amnesia: Secondary | ICD-10-CM | POA: Diagnosis not present

## 2022-07-20 DIAGNOSIS — R251 Tremor, unspecified: Secondary | ICD-10-CM | POA: Diagnosis not present

## 2022-07-20 DIAGNOSIS — I509 Heart failure, unspecified: Secondary | ICD-10-CM | POA: Diagnosis not present

## 2022-08-01 DIAGNOSIS — I5032 Chronic diastolic (congestive) heart failure: Secondary | ICD-10-CM | POA: Diagnosis not present

## 2022-08-07 DIAGNOSIS — R9439 Abnormal result of other cardiovascular function study: Secondary | ICD-10-CM | POA: Diagnosis not present

## 2022-08-10 ENCOUNTER — Inpatient Hospital Stay: Payer: Medicare HMO | Attending: Oncology

## 2022-08-10 DIAGNOSIS — D472 Monoclonal gammopathy: Secondary | ICD-10-CM | POA: Insufficient documentation

## 2022-08-10 DIAGNOSIS — D539 Nutritional anemia, unspecified: Secondary | ICD-10-CM | POA: Diagnosis not present

## 2022-08-10 LAB — CBC WITH DIFFERENTIAL (CANCER CENTER ONLY)
Abs Immature Granulocytes: 0 10*3/uL (ref 0.00–0.07)
Basophils Absolute: 0 10*3/uL (ref 0.0–0.1)
Basophils Relative: 0 %
Eosinophils Absolute: 0 10*3/uL (ref 0.0–0.5)
Eosinophils Relative: 1 %
HCT: 40.3 % (ref 39.0–52.0)
Hemoglobin: 13.2 g/dL (ref 13.0–17.0)
Immature Granulocytes: 0 %
Lymphocytes Relative: 24 %
Lymphs Abs: 0.9 10*3/uL (ref 0.7–4.0)
MCH: 34.5 pg — ABNORMAL HIGH (ref 26.0–34.0)
MCHC: 32.8 g/dL (ref 30.0–36.0)
MCV: 105.2 fL — ABNORMAL HIGH (ref 80.0–100.0)
Monocytes Absolute: 0.2 10*3/uL (ref 0.1–1.0)
Monocytes Relative: 6 %
Neutro Abs: 2.5 10*3/uL (ref 1.7–7.7)
Neutrophils Relative %: 69 %
Platelet Count: 157 10*3/uL (ref 150–400)
RBC: 3.83 MIL/uL — ABNORMAL LOW (ref 4.22–5.81)
RDW: 12.1 % (ref 11.5–15.5)
WBC Count: 3.5 10*3/uL — ABNORMAL LOW (ref 4.0–10.5)
nRBC: 0 % (ref 0.0–0.2)

## 2022-08-10 LAB — CMP (CANCER CENTER ONLY)
ALT: 14 U/L (ref 0–44)
AST: 20 U/L (ref 15–41)
Albumin: 4.1 g/dL (ref 3.5–5.0)
Alkaline Phosphatase: 77 U/L (ref 38–126)
Anion gap: 8 (ref 5–15)
BUN: 20 mg/dL (ref 8–23)
CO2: 29 mmol/L (ref 22–32)
Calcium: 9.2 mg/dL (ref 8.9–10.3)
Chloride: 103 mmol/L (ref 98–111)
Creatinine: 1.25 mg/dL — ABNORMAL HIGH (ref 0.61–1.24)
GFR, Estimated: 59 mL/min — ABNORMAL LOW (ref >60)
Glucose, Bld: 77 mg/dL (ref 70–99)
Potassium: 3.9 mmol/L (ref 3.5–5.1)
Sodium: 140 mmol/L (ref 135–145)
Total Bilirubin: 0.4 mg/dL (ref 0.3–1.2)
Total Protein: 6.9 g/dL (ref 6.5–8.1)

## 2022-08-13 LAB — KAPPA/LAMBDA LIGHT CHAINS
Kappa free light chain: 24.2 mg/L — ABNORMAL HIGH (ref 3.3–19.4)
Kappa, lambda light chain ratio: 0.11 — ABNORMAL LOW (ref 0.26–1.65)
Lambda free light chains: 218.5 mg/L — ABNORMAL HIGH (ref 5.7–26.3)

## 2022-08-15 LAB — MULTIPLE MYELOMA PANEL, SERUM
Albumin SerPl Elph-Mcnc: 3.9 g/dL (ref 2.9–4.4)
Albumin/Glob SerPl: 1.7 (ref 0.7–1.7)
Alpha 1: 0.2 g/dL (ref 0.0–0.4)
Alpha2 Glob SerPl Elph-Mcnc: 0.5 g/dL (ref 0.4–1.0)
B-Globulin SerPl Elph-Mcnc: 0.8 g/dL (ref 0.7–1.3)
Gamma Glob SerPl Elph-Mcnc: 0.9 g/dL (ref 0.4–1.8)
Globulin, Total: 2.3 g/dL (ref 2.2–3.9)
IgA: 99 mg/dL (ref 61–437)
IgG (Immunoglobin G), Serum: 1064 mg/dL (ref 603–1613)
IgM (Immunoglobulin M), Srm: 25 mg/dL (ref 15–143)
Total Protein ELP: 6.2 g/dL (ref 6.0–8.5)

## 2022-08-17 ENCOUNTER — Inpatient Hospital Stay: Payer: Medicare HMO | Admitting: Oncology

## 2022-08-17 ENCOUNTER — Other Ambulatory Visit: Payer: Self-pay

## 2022-08-17 ENCOUNTER — Encounter: Payer: Self-pay | Admitting: Oncology

## 2022-08-17 VITALS — BP 108/71 | HR 77 | Temp 97.5°F | Resp 18 | Ht 68.0 in | Wt 147.9 lb

## 2022-08-17 DIAGNOSIS — D472 Monoclonal gammopathy: Secondary | ICD-10-CM

## 2022-08-17 DIAGNOSIS — R634 Abnormal weight loss: Secondary | ICD-10-CM | POA: Diagnosis not present

## 2022-08-17 DIAGNOSIS — D539 Nutritional anemia, unspecified: Secondary | ICD-10-CM

## 2022-08-17 NOTE — Progress Notes (Signed)
No concerns for the provider today. 

## 2022-08-17 NOTE — Progress Notes (Signed)
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  Bone marrow biopsy showed 8% plasma cells, normal cytogenetics, negative congo red staining.  T (11;14), negative skeletal survey, no anemia, kidney function is worse.  Lab Results  Component Value Date   MPROTEIN Not Observed 08/10/2022   KPAFRELGTCHN 24.2 (H) 08/10/2022   LAMBDASER 218.5 (H) 08/10/2022   KAPLAMBRATIO 0.11 (L) 08/10/2022   24 hour UPEP is pending I recommend close monitor and repeat levels in 4 months.      Weight loss Improved. He has gained weight.   Anemia, macrocytic Normal vitamin B12 level and folate level. Due to monoclonal gammopathy Monitor    Orders Placed This Encounter  Procedures   CMP (Cancer Center only)    Standing Status:   Future    Standing Expiration Date:   08/17/2023   CBC with Differential (Cancer Center Only)    Standing Status:   Future    Standing Expiration Date:   08/17/2023   Multiple Myeloma Panel (SPEP&IFE w/QIG)    Standing Status:   Future    Standing Expiration Date:   08/17/2023   Kappa/lambda light chains    Standing Status:   Future    Standing Expiration Date:   08/17/2023   Lactate dehydrogenase    Standing Status:   Future    Standing Expiration Date:   08/17/2023   Beta 2 microglobulin, serum    Standing Status:   Future    Standing Expiration Date:   08/17/2023   # follow-up 6 months.  All questions were answered. The patient knows to call the clinic with any problems, questions or concerns.  Rickard Patience, MD, PhD [facility] Hematology Oncology 08/17/2022     HISTORY  OF PRESENTING ILLNESS:   Patient had blood work done on 09/20/2021, CBC showed white count of 3.7, differential shows lymphocytopenia.  Hemoglobin 13.4, MCV 104.6.  Vitamin B12 265.  Patient received 4 doses of vitamin B12 injections 02/02/2022 repeat B12 level >1500.  TSH was normal.  CBC shows persistent leukopenia, predominantly lymphocytopenia.  Persistent mild decrease of hemoglobin of 13.3, MCV 106. Patient denies any routine alcohol use.  Denies any recent infection He has lost weight, February 2023, he weighed 159 pounds.  Currently he weighs 140 pounds.  Appetite is fair and wife reports patient is eating his usual portion of food.  Denies night sweats or fever. Denies any over-the-counter supplement intake.   INTERVAL HISTORY Brendan Carter is a 77 y.o. male who has above history reviewed by me today presents for follow up visit for light chain MGUS No new complaints.   MEDICAL HISTORY:  Past Medical History:  Diagnosis Date   BPH (benign prostatic hyperplasia)    Elevated PSA    Hydrocele    Hypertension    Inguinal hernia    left   Prostatitis    Weak urinary stream     SURGICAL HISTORY: Past Surgical History:  Procedure Laterality Date   HERNIA REPAIR Right    hydrocelectomy Right 2013    SOCIAL HISTORY: Social History   Socioeconomic History   Marital status: Single  Spouse name: Not on file   Number of children: Not on file   Years of education: Not on file   Highest education level: Not on file  Occupational History   Not on file  Tobacco Use   Smoking status: Never    Passive exposure: Never   Smokeless tobacco: Never  Substance and Sexual Activity   Alcohol use: No    Alcohol/week: 0.0 standard drinks of alcohol   Drug use: No   Sexual activity: Not on file  Other Topics Concern   Not on file  Social History Narrative   Not on file   Social Determinants of Health   Financial Resource Strain: Not on file  Food Insecurity: Not on file   Transportation Needs: Not on file  Physical Activity: Not on file  Stress: Not on file  Social Connections: Not on file  Intimate Partner Violence: Not on file    FAMILY HISTORY: Family History  Problem Relation Age of Onset   Kidney cancer Neg Hx    Kidney disease Neg Hx    Prostate cancer Neg Hx     ALLERGIES:  has No Known Allergies.  MEDICATIONS:  Current Outpatient Medications  Medication Sig Dispense Refill   carbidopa-levodopa (SINEMET IR) 25-100 MG tablet Take by mouth.     donepezil (ARICEPT) 5 MG tablet Take 1 tablet by mouth at bedtime.     finasteride (PROSCAR) 5 MG tablet Take 1 tablet (5 mg total) by mouth daily. 90 tablet 3   furosemide (LASIX) 20 MG tablet Take 1 tablet by mouth every other day.     mirtazapine (REMERON) 30 MG tablet Take by mouth.     nitroGLYCERIN (NITROSTAT) 0.4 MG SL tablet Place under the tongue.     No current facility-administered medications for this visit.    Review of Systems  Constitutional:  Negative for appetite change, chills, fatigue, fever and unexpected weight change.  HENT:   Negative for hearing loss and voice change.   Eyes:  Negative for eye problems and icterus.  Respiratory:  Negative for chest tightness, cough and shortness of breath.   Cardiovascular:  Negative for chest pain and leg swelling.  Gastrointestinal:  Negative for abdominal distention and abdominal pain.  Endocrine: Negative for hot flashes.  Genitourinary:  Negative for difficulty urinating, dysuria and frequency.   Musculoskeletal:  Negative for arthralgias.  Skin:  Negative for itching and rash.  Neurological:  Negative for light-headedness and numbness.  Hematological:  Negative for adenopathy. Does not bruise/bleed easily.  Psychiatric/Behavioral:  Negative for confusion.     PHYSICAL EXAMINATION: ECOG PERFORMANCE STATUS: 0 - Asymptomatic Vitals:   08/17/22 1004  BP: 108/71  Pulse: 77  Resp: 18  Temp: (!) 97.5 F (36.4 C)  SpO2: 97%    Filed Weights   08/17/22 1004  Weight: 147 lb 14.4 oz (67.1 kg)    Physical Exam Constitutional:      General: He is not in acute distress. HENT:     Head: Normocephalic.  Eyes:     General: No scleral icterus. Cardiovascular:     Rate and Rhythm: Normal rate.  Pulmonary:     Effort: Pulmonary effort is normal. No respiratory distress.  Abdominal:     General: Bowel sounds are normal. There is no distension.     Palpations: Abdomen is soft.  Musculoskeletal:        General: No deformity. Normal range of motion.     Cervical back: Normal range of motion  and neck supple.  Skin:    Findings: No rash.  Neurological:     Mental Status: He is alert and oriented to person, place, and time. Mental status is at baseline.     Cranial Nerves: No cranial nerve deficit.  Psychiatric:        Mood and Affect: Mood normal.      LABORATORY DATA:  I have reviewed the data as listed     Latest Ref Rng & Units 08/10/2022   10:10 AM 03/30/2022    7:57 AM 02/13/2022   11:15 AM  CBC  WBC 4.0 - 10.5 K/uL 3.5  3.5  3.8   Hemoglobin 13.0 - 17.0 g/dL 74.2  59.5  63.8   Hematocrit 39.0 - 52.0 % 40.3  37.4  38.1   Platelets 150 - 400 K/uL 157  143  164       Latest Ref Rng & Units 08/10/2022   10:10 AM 02/13/2022   11:15 AM 05/11/2019   11:57 AM  CMP  Glucose 70 - 99 mg/dL 77  94    BUN 8 - 23 mg/dL 20  23    Creatinine 7.56 - 1.24 mg/dL 4.33  2.95  1.88   Sodium 135 - 145 mmol/L 140  143    Potassium 3.5 - 5.1 mmol/L 3.9  4.0    Chloride 98 - 111 mmol/L 103  110    CO2 22 - 32 mmol/L 29  30    Calcium 8.9 - 10.3 mg/dL 9.2  8.9    Total Protein 6.5 - 8.1 g/dL 6.9  6.2    Total Bilirubin 0.3 - 1.2 mg/dL 0.4  0.5    Alkaline Phos 38 - 126 U/L 77  81    AST 15 - 41 U/L 20  23    ALT 0 - 44 U/L 14  21       RADIOGRAPHIC STUDIES: I have personally reviewed the radiological images as listed and agreed with the findings in the report. No results found.

## 2022-08-17 NOTE — Assessment & Plan Note (Addendum)
Improved. He has gained weight.

## 2022-08-17 NOTE — Assessment & Plan Note (Signed)
Normal vitamin B12 level and folate level. Due to monoclonal gammopathy Monitor  

## 2022-08-17 NOTE — Assessment & Plan Note (Addendum)
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  Bone marrow biopsy showed 8% plasma cells, normal cytogenetics, negative congo red staining.  T (11;14), negative skeletal survey, no anemia, kidney function is worse.  Lab Results  Component Value Date   MPROTEIN Not Observed 08/10/2022   KPAFRELGTCHN 24.2 (H) 08/10/2022   LAMBDASER 218.5 (H) 08/10/2022   KAPLAMBRATIO 0.11 (L) 08/10/2022   24 hour UPEP is pending I recommend close monitor and repeat levels in 4 months.

## 2022-08-20 ENCOUNTER — Ambulatory Visit: Payer: Medicare HMO | Admitting: Oncology

## 2022-08-22 LAB — IFE+PROTEIN ELECTRO, 24-HR UR
% BETA, Urine: 14.7 %
ALPHA 1 URINE: 2.9 %
Albumin, U: 36.4 %
Alpha 2, Urine: 7 %
GAMMA GLOBULIN URINE: 39 %
M-SPIKE %, Urine: 27.9 % — ABNORMAL HIGH
M-Spike, Mg/24 Hr: 134 mg/24 hr — ABNORMAL HIGH
Total Protein, Urine-Ur/day: 482 mg/24 hr — ABNORMAL HIGH (ref 30–150)
Total Protein, Urine: 32.1 mg/dL
Total Volume: 1500

## 2022-09-07 DIAGNOSIS — G309 Alzheimer's disease, unspecified: Secondary | ICD-10-CM | POA: Diagnosis not present

## 2022-09-07 DIAGNOSIS — F0280 Dementia in other diseases classified elsewhere without behavioral disturbance: Secondary | ICD-10-CM | POA: Diagnosis not present

## 2022-09-07 DIAGNOSIS — I679 Cerebrovascular disease, unspecified: Secondary | ICD-10-CM | POA: Diagnosis not present

## 2022-09-07 DIAGNOSIS — R413 Other amnesia: Secondary | ICD-10-CM | POA: Diagnosis not present

## 2022-09-07 DIAGNOSIS — R251 Tremor, unspecified: Secondary | ICD-10-CM | POA: Diagnosis not present

## 2022-10-19 DIAGNOSIS — I509 Heart failure, unspecified: Secondary | ICD-10-CM | POA: Diagnosis not present

## 2022-10-19 DIAGNOSIS — R9439 Abnormal result of other cardiovascular function study: Secondary | ICD-10-CM | POA: Diagnosis not present

## 2022-10-19 DIAGNOSIS — D649 Anemia, unspecified: Secondary | ICD-10-CM | POA: Diagnosis not present

## 2022-10-19 DIAGNOSIS — E538 Deficiency of other specified B group vitamins: Secondary | ICD-10-CM | POA: Diagnosis not present

## 2022-10-19 DIAGNOSIS — E8809 Other disorders of plasma-protein metabolism, not elsewhere classified: Secondary | ICD-10-CM | POA: Diagnosis not present

## 2022-10-19 DIAGNOSIS — R413 Other amnesia: Secondary | ICD-10-CM | POA: Diagnosis not present

## 2022-10-26 DIAGNOSIS — R9439 Abnormal result of other cardiovascular function study: Secondary | ICD-10-CM | POA: Diagnosis not present

## 2022-11-30 ENCOUNTER — Inpatient Hospital Stay: Payer: Medicare HMO | Attending: Oncology

## 2022-11-30 DIAGNOSIS — D472 Monoclonal gammopathy: Secondary | ICD-10-CM | POA: Insufficient documentation

## 2022-11-30 DIAGNOSIS — D539 Nutritional anemia, unspecified: Secondary | ICD-10-CM | POA: Insufficient documentation

## 2022-11-30 LAB — CBC WITH DIFFERENTIAL (CANCER CENTER ONLY)
Abs Immature Granulocytes: 0.01 10*3/uL (ref 0.00–0.07)
Basophils Absolute: 0 10*3/uL (ref 0.0–0.1)
Basophils Relative: 1 %
Eosinophils Absolute: 0 10*3/uL (ref 0.0–0.5)
Eosinophils Relative: 1 %
HCT: 42.3 % (ref 39.0–52.0)
Hemoglobin: 14.3 g/dL (ref 13.0–17.0)
Immature Granulocytes: 0 %
Lymphocytes Relative: 25 %
Lymphs Abs: 0.8 10*3/uL (ref 0.7–4.0)
MCH: 35.2 pg — ABNORMAL HIGH (ref 26.0–34.0)
MCHC: 33.8 g/dL (ref 30.0–36.0)
MCV: 104.2 fL — ABNORMAL HIGH (ref 80.0–100.0)
Monocytes Absolute: 0.2 10*3/uL (ref 0.1–1.0)
Monocytes Relative: 7 %
Neutro Abs: 2.1 10*3/uL (ref 1.7–7.7)
Neutrophils Relative %: 66 %
Platelet Count: 180 10*3/uL (ref 150–400)
RBC: 4.06 MIL/uL — ABNORMAL LOW (ref 4.22–5.81)
RDW: 11.9 % (ref 11.5–15.5)
WBC Count: 3.2 10*3/uL — ABNORMAL LOW (ref 4.0–10.5)
nRBC: 0 % (ref 0.0–0.2)

## 2022-11-30 LAB — CMP (CANCER CENTER ONLY)
ALT: 19 U/L (ref 0–44)
AST: 23 U/L (ref 15–41)
Albumin: 4.4 g/dL (ref 3.5–5.0)
Alkaline Phosphatase: 81 U/L (ref 38–126)
Anion gap: 5 (ref 5–15)
BUN: 19 mg/dL (ref 8–23)
CO2: 28 mmol/L (ref 22–32)
Calcium: 9.3 mg/dL (ref 8.9–10.3)
Chloride: 105 mmol/L (ref 98–111)
Creatinine: 1.23 mg/dL (ref 0.61–1.24)
GFR, Estimated: >60 mL/min (ref >60)
Glucose, Bld: 102 mg/dL — ABNORMAL HIGH (ref 70–99)
Potassium: 4 mmol/L (ref 3.5–5.1)
Sodium: 138 mmol/L (ref 135–145)
Total Bilirubin: 0.6 mg/dL (ref 0.3–1.2)
Total Protein: 7.5 g/dL (ref 6.5–8.1)

## 2022-11-30 LAB — LACTATE DEHYDROGENASE: LDH: 185 U/L (ref 98–192)

## 2022-12-01 LAB — BETA 2 MICROGLOBULIN, SERUM: Beta-2 Microglobulin: 1.5 mg/L (ref 0.6–2.4)

## 2022-12-02 LAB — KAPPA/LAMBDA LIGHT CHAINS
Kappa free light chain: 22.3 mg/L — ABNORMAL HIGH (ref 3.3–19.4)
Kappa, lambda light chain ratio: 0.21 — ABNORMAL LOW (ref 0.26–1.65)
Lambda free light chains: 103.8 mg/L — ABNORMAL HIGH (ref 5.7–26.3)

## 2022-12-04 LAB — MULTIPLE MYELOMA PANEL, SERUM
Albumin SerPl Elph-Mcnc: 4 g/dL (ref 2.9–4.4)
Albumin/Glob SerPl: 1.5 (ref 0.7–1.7)
Alpha 1: 0.2 g/dL (ref 0.0–0.4)
Alpha2 Glob SerPl Elph-Mcnc: 0.5 g/dL (ref 0.4–1.0)
B-Globulin SerPl Elph-Mcnc: 0.9 g/dL (ref 0.7–1.3)
Gamma Glob SerPl Elph-Mcnc: 1.1 g/dL (ref 0.4–1.8)
Globulin, Total: 2.7 g/dL (ref 2.2–3.9)
IgA: 112 mg/dL (ref 61–437)
IgG (Immunoglobin G), Serum: 1189 mg/dL (ref 603–1613)
IgM (Immunoglobulin M), Srm: 35 mg/dL (ref 15–143)
Total Protein ELP: 6.7 g/dL (ref 6.0–8.5)

## 2022-12-07 ENCOUNTER — Inpatient Hospital Stay: Payer: Medicare HMO | Admitting: Oncology

## 2022-12-07 ENCOUNTER — Encounter: Payer: Self-pay | Admitting: Oncology

## 2022-12-07 VITALS — BP 114/71 | HR 87 | Temp 97.5°F | Resp 18 | Wt 147.3 lb

## 2022-12-07 DIAGNOSIS — D472 Monoclonal gammopathy: Secondary | ICD-10-CM | POA: Diagnosis not present

## 2022-12-07 DIAGNOSIS — D539 Nutritional anemia, unspecified: Secondary | ICD-10-CM

## 2022-12-07 NOTE — Assessment & Plan Note (Signed)
Normal vitamin B12 level and folate level. Due to monoclonal gammopathy Monitor  

## 2022-12-07 NOTE — Progress Notes (Signed)
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  Bone marrow biopsy showed 8% plasma cells, normal cytogenetics, negative congo red staining.  T (11;14), negative skeletal survey, no anemia, kidney function is worse.  Lab Results  Component Value Date   MPROTEIN Not Observed 11/30/2022   KPAFRELGTCHN 22.3 (H) 11/30/2022   LAMBDASER 103.8 (H) 11/30/2022   KAPLAMBRATIO 0.21 (L) 11/30/2022   24 hour UPEP is pending I recommend close monitor and repeat levels in 4 months.      Anemia, macrocytic Normal vitamin B12 level and folate level. Due to monoclonal gammopathy Monitor    Orders Placed This Encounter  Procedures   CBC with Differential (Cancer Center Only)    Standing Status:   Future    Standing Expiration Date:   12/07/2023   CMP (Cancer Center only)    Standing Status:   Future    Standing Expiration Date:   12/07/2023   Kappa/lambda light chains    Standing Status:   Future    Standing Expiration Date:   12/07/2023   Multiple Myeloma Panel (SPEP&IFE w/QIG)    Standing Status:   Future    Standing Expiration Date:   12/07/2023   Beta 2 microglobulin, serum    Standing Status:   Future    Standing Expiration Date:   12/07/2023   IFE+PROTEIN ELECTRO, 24-HR UR    Standing Status:   Future    Standing Expiration Date:   12/07/2023   IFE+PROTEIN ELECTRO, 24-HR UR    Standing Status:   Future    Standing Expiration Date:   12/07/2023   # follow-up 4 months.  All questions were answered. The patient knows to call the clinic with any problems, questions or concerns.  Rickard Patience, MD, PhD [facility] Hematology Oncology 12/07/2022     HISTORY OF PRESENTING ILLNESS:   Patient had blood work done on 09/20/2021, CBC showed white count of 3.7, differential shows lymphocytopenia.  Hemoglobin 13.4, MCV 104.6.  Vitamin B12 265.  Patient received 4 doses of vitamin B12 injections 02/02/2022 repeat B12 level >1500.  TSH was normal.  CBC shows persistent leukopenia, predominantly lymphocytopenia.  Persistent mild decrease of hemoglobin of 13.3, MCV 106. Patient denies any routine alcohol use.  Denies any recent infection Brendan Carter has lost weight, February 2023, Brendan Carter weighed 159 pounds.  Currently Brendan Carter weighs 140 pounds.  Appetite is fair and wife reports patient is eating his usual portion of food.  Denies night sweats or fever. Denies any over-the-counter supplement intake.   INTERVAL HISTORY Brendan Carter is a 77 y.o. male who has above history reviewed by me today presents for follow up visit for light chain MGUS No new complaints.   MEDICAL HISTORY:  Past Medical History:  Diagnosis Date   BPH (benign prostatic hyperplasia)    Elevated PSA    Hydrocele    Hypertension    Inguinal hernia    left   Prostatitis    Weak urinary stream     SURGICAL HISTORY: Past Surgical History:  Procedure Laterality Date   HERNIA REPAIR Right    hydrocelectomy Right 2013  SOCIAL HISTORY: Social History   Socioeconomic History   Marital status: Single    Spouse name: Not on file   Number of children: Not on file   Years of education: Not on file   Highest education level: Not on file  Occupational History   Not on file  Tobacco Use   Smoking status: Never    Passive exposure: Never   Smokeless tobacco: Never  Substance and Sexual Activity   Alcohol use: No    Alcohol/week: 0.0 standard drinks of alcohol   Drug use: No   Sexual activity: Not on file  Other Topics Concern   Not on file  Social History Narrative   Not on file   Social Determinants of Health    Financial Resource Strain: Low Risk  (10/19/2022)   Received from [facility]   Overall Financial Resource Strain (CARDIA)    Difficulty of Paying Living Expenses: Not hard at all  Food Insecurity: No Food Insecurity (10/19/2022)   Received from [facility]   Hunger Vital Sign    Worried About Running Out of Food in the Last Year: Never true    Ran Out of Food in the Last Year: Never true  Transportation Needs: No Transportation Needs (10/19/2022)   Received from [facility] Transportation    In the past 12 months, has lack of transportation kept you from medical appointments or from getting medications?: No    Lack of Transportation (Non-Medical): No  Physical Activity: Not on file  Stress: Not on file  Social Connections: Not on file  Intimate Partner Violence: Not on file    FAMILY HISTORY: Family History  Problem Relation Age of Onset   Kidney cancer Neg Hx    Kidney disease Neg Hx    Prostate cancer Neg Hx     ALLERGIES:  has No Known Allergies.  MEDICATIONS:  Current Outpatient Medications  Medication Sig Dispense Refill   carbidopa-levodopa (SINEMET IR) 25-100 MG tablet Take by mouth.     donepezil (ARICEPT) 5 MG tablet Take 1 tablet by mouth at bedtime.     finasteride (PROSCAR) 5 MG tablet Take 1 tablet (5 mg total) by mouth daily. 90 tablet 3   furosemide (LASIX) 20 MG tablet Take 1 tablet by mouth every other day.     mirtazapine (REMERON) 30 MG tablet Take by mouth.     nitroGLYCERIN (NITROSTAT) 0.4 MG SL tablet Place under the tongue. (Patient not taking: Reported on 12/07/2022)     No current facility-administered medications for this visit.    Review of Systems  Constitutional:  Negative for appetite change, chills, fatigue, fever and unexpected weight change.  HENT:   Negative for hearing loss and voice change.   Eyes:  Negative for eye problems and icterus.  Respiratory:  Negative for  chest tightness, cough and shortness of breath.   Cardiovascular:  Negative for chest pain and leg swelling.  Gastrointestinal:  Negative for abdominal distention and abdominal pain.  Endocrine: Negative for hot flashes.  Genitourinary:  Negative for difficulty urinating, dysuria and frequency.   Musculoskeletal:  Negative for arthralgias.  Skin:  Negative for itching and rash.  Neurological:  Negative for light-headedness and numbness.  Hematological:  Negative for adenopathy. Does not bruise/bleed easily.  Psychiatric/Behavioral:  Negative for confusion.     PHYSICAL EXAMINATION: ECOG PERFORMANCE STATUS: 0 - Asymptomatic Vitals:   12/07/22 1011  BP: 114/71  Pulse: 87  Resp: 18  Temp: (!) 97.5 F (36.4 C)   Filed Weights   12/07/22 1011  Weight: 147 lb 4.8 oz (66.8 kg)    Physical Exam Constitutional:      General: Brendan Carter is not in acute distress. HENT:     Head: Normocephalic.  Eyes:     General: No scleral icterus. Cardiovascular:     Rate and Rhythm: Normal rate.  Pulmonary:     Effort: Pulmonary effort is normal. No respiratory distress.  Abdominal:     General: Bowel sounds are normal. There is no distension.     Palpations: Abdomen is soft.  Musculoskeletal:        General: No deformity. Normal range of motion.     Cervical back: Normal range of motion and neck supple.  Skin:    Findings: No rash.  Neurological:     Mental Status: Brendan Carter is alert and oriented to person, place, and time. Mental status is at baseline.     Cranial Nerves: No cranial nerve deficit.  Psychiatric:        Mood and Affect: Mood normal.      LABORATORY DATA:  I have reviewed the data as listed     Latest Ref Rng & Units 11/30/2022   10:36 AM 08/10/2022   10:10 AM 03/30/2022    7:57 AM  CBC  WBC 4.0 - 10.5 K/uL 3.2  3.5  3.5   Hemoglobin 13.0 - 17.0 g/dL 36.6  44.0  34.7   Hematocrit 39.0 - 52.0 % 42.3  40.3  37.4   Platelets 150 - 400 K/uL 180  157  143       Latest Ref Rng &  Units 11/30/2022   10:36 AM 08/10/2022   10:10 AM 02/13/2022   11:15 AM  CMP  Glucose 70 - 99 mg/dL 425  77  94   BUN 8 - 23 mg/dL 19  20  23    Creatinine 0.61 - 1.24 mg/dL 9.56  3.87  5.64   Sodium 135 - 145 mmol/L 138  140  143   Potassium 3.5 - 5.1 mmol/L 4.0  3.9  4.0   Chloride 98 - 111 mmol/L 105  103  110   CO2 22 - 32 mmol/L 28  29  30    Calcium 8.9 - 10.3 mg/dL 9.3  9.2  8.9   Total Protein 6.5 - 8.1 g/dL 7.5  6.9  6.2   Total Bilirubin 0.3 - 1.2 mg/dL 0.6  0.4  0.5   Alkaline Phos 38 - 126 U/L 81  77  81   AST 15 - 41 U/L 23  20  23    ALT 0 - 44 U/L 19  14  21       RADIOGRAPHIC STUDIES: I have personally reviewed the radiological images as listed and agreed with the findings in the report. No results found.

## 2022-12-07 NOTE — Assessment & Plan Note (Signed)
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  Bone marrow biopsy showed 8% plasma cells, normal cytogenetics, negative congo red staining.  T (11;14), negative skeletal survey, no anemia, kidney function is worse.  Lab Results  Component Value Date   MPROTEIN Not Observed 11/30/2022   KPAFRELGTCHN 22.3 (H) 11/30/2022   LAMBDASER 103.8 (H) 11/30/2022   KAPLAMBRATIO 0.21 (L) 11/30/2022   24 hour UPEP is pending I recommend close monitor and repeat levels in 4 months.

## 2022-12-26 ENCOUNTER — Other Ambulatory Visit: Payer: Self-pay

## 2022-12-26 ENCOUNTER — Inpatient Hospital Stay: Payer: Medicare HMO | Attending: Oncology

## 2022-12-26 DIAGNOSIS — D472 Monoclonal gammopathy: Secondary | ICD-10-CM | POA: Insufficient documentation

## 2022-12-26 DIAGNOSIS — D539 Nutritional anemia, unspecified: Secondary | ICD-10-CM | POA: Insufficient documentation

## 2022-12-31 LAB — IFE+PROTEIN ELECTRO, 24-HR UR
% BETA, Urine: 20.2 %
ALPHA 1 URINE: 2.7 %
Albumin, U: 46 %
Alpha 2, Urine: 6.5 %
GAMMA GLOBULIN URINE: 24.6 %
M-SPIKE %, Urine: 17.8 % — ABNORMAL HIGH
M-Spike, Mg/24 Hr: 67 mg/(24.h) — ABNORMAL HIGH
Total Protein, Urine-Ur/day: 375 mg/(24.h) — ABNORMAL HIGH (ref 30–150)
Total Protein, Urine: 21.1 mg/dL
Total Volume: 1775

## 2023-01-04 DIAGNOSIS — R251 Tremor, unspecified: Secondary | ICD-10-CM | POA: Diagnosis not present

## 2023-01-04 DIAGNOSIS — I679 Cerebrovascular disease, unspecified: Secondary | ICD-10-CM | POA: Diagnosis not present

## 2023-01-04 DIAGNOSIS — R413 Other amnesia: Secondary | ICD-10-CM | POA: Diagnosis not present

## 2023-01-29 ENCOUNTER — Ambulatory Visit
Admission: RE | Admit: 2023-01-29 | Discharge: 2023-01-29 | Disposition: A | Discharge status: Home / Self Care | Payer: Medicare HMO | Source: Ambulatory Visit | Attending: Physician Assistant | Admitting: Physician Assistant

## 2023-01-29 ENCOUNTER — Other Ambulatory Visit: Payer: Self-pay | Admitting: Physician Assistant

## 2023-01-29 DIAGNOSIS — M7989 Other specified soft tissue disorders: Secondary | ICD-10-CM

## 2023-01-29 DIAGNOSIS — R600 Localized edema: Secondary | ICD-10-CM | POA: Diagnosis not present

## 2023-01-29 DIAGNOSIS — R21 Rash and other nonspecific skin eruption: Secondary | ICD-10-CM | POA: Diagnosis not present

## 2023-02-08 DIAGNOSIS — M7989 Other specified soft tissue disorders: Secondary | ICD-10-CM | POA: Diagnosis not present

## 2023-02-08 DIAGNOSIS — D472 Monoclonal gammopathy: Secondary | ICD-10-CM | POA: Diagnosis not present

## 2023-02-08 DIAGNOSIS — I5032 Chronic diastolic (congestive) heart failure: Secondary | ICD-10-CM | POA: Diagnosis not present

## 2023-02-08 DIAGNOSIS — R413 Other amnesia: Secondary | ICD-10-CM | POA: Diagnosis not present

## 2023-02-08 DIAGNOSIS — N1831 Chronic kidney disease, stage 3a: Secondary | ICD-10-CM | POA: Diagnosis not present

## 2023-03-14 ENCOUNTER — Other Ambulatory Visit: Payer: Self-pay | Admitting: Urology

## 2023-03-14 DIAGNOSIS — N400 Enlarged prostate without lower urinary tract symptoms: Secondary | ICD-10-CM

## 2023-03-20 ENCOUNTER — Other Ambulatory Visit: Payer: Medicare HMO

## 2023-03-20 DIAGNOSIS — R3912 Poor urinary stream: Secondary | ICD-10-CM | POA: Diagnosis not present

## 2023-03-20 DIAGNOSIS — N401 Enlarged prostate with lower urinary tract symptoms: Secondary | ICD-10-CM

## 2023-03-21 ENCOUNTER — Other Ambulatory Visit
Admission: RE | Admit: 2023-03-21 | Discharge: 2023-03-21 | Disposition: A | Discharge status: Home / Self Care | Payer: Medicare HMO | Source: Ambulatory Visit | Attending: Infectious Diseases | Admitting: Infectious Diseases

## 2023-03-21 DIAGNOSIS — Z23 Encounter for immunization: Secondary | ICD-10-CM | POA: Insufficient documentation

## 2023-03-21 DIAGNOSIS — N1831 Chronic kidney disease, stage 3a: Secondary | ICD-10-CM | POA: Insufficient documentation

## 2023-03-21 DIAGNOSIS — I509 Heart failure, unspecified: Secondary | ICD-10-CM | POA: Diagnosis not present

## 2023-03-21 DIAGNOSIS — I5032 Chronic diastolic (congestive) heart failure: Secondary | ICD-10-CM | POA: Insufficient documentation

## 2023-03-21 DIAGNOSIS — M7989 Other specified soft tissue disorders: Secondary | ICD-10-CM | POA: Insufficient documentation

## 2023-03-21 DIAGNOSIS — D472 Monoclonal gammopathy: Secondary | ICD-10-CM | POA: Diagnosis not present

## 2023-03-21 DIAGNOSIS — E8809 Other disorders of plasma-protein metabolism, not elsewhere classified: Secondary | ICD-10-CM | POA: Diagnosis not present

## 2023-03-21 DIAGNOSIS — R413 Other amnesia: Secondary | ICD-10-CM | POA: Diagnosis not present

## 2023-03-21 LAB — BRAIN NATRIURETIC PEPTIDE: B Natriuretic Peptide: 32.1 pg/mL (ref 0.0–100.0)

## 2023-03-21 LAB — PSA: Prostate Specific Ag, Serum: 14.8 ng/mL — ABNORMAL HIGH (ref 0.0–4.0)

## 2023-03-26 ENCOUNTER — Ambulatory Visit: Payer: Self-pay | Admitting: Urology

## 2023-04-12 ENCOUNTER — Other Ambulatory Visit: Payer: Medicare HMO

## 2023-04-19 ENCOUNTER — Ambulatory Visit: Payer: Medicare HMO | Admitting: Oncology

## 2023-04-19 ENCOUNTER — Inpatient Hospital Stay: Payer: Medicare HMO | Attending: Oncology

## 2023-04-19 DIAGNOSIS — D539 Nutritional anemia, unspecified: Secondary | ICD-10-CM | POA: Insufficient documentation

## 2023-04-19 DIAGNOSIS — I509 Heart failure, unspecified: Secondary | ICD-10-CM | POA: Diagnosis not present

## 2023-04-19 DIAGNOSIS — R413 Other amnesia: Secondary | ICD-10-CM | POA: Diagnosis not present

## 2023-04-19 DIAGNOSIS — M7989 Other specified soft tissue disorders: Secondary | ICD-10-CM | POA: Diagnosis not present

## 2023-04-19 DIAGNOSIS — E8809 Other disorders of plasma-protein metabolism, not elsewhere classified: Secondary | ICD-10-CM | POA: Diagnosis not present

## 2023-04-19 DIAGNOSIS — D472 Monoclonal gammopathy: Secondary | ICD-10-CM | POA: Diagnosis not present

## 2023-04-19 LAB — CMP (CANCER CENTER ONLY)
ALT: 17 U/L (ref 0–44)
AST: 24 U/L (ref 15–41)
Albumin: 3.9 g/dL (ref 3.5–5.0)
Alkaline Phosphatase: 66 U/L (ref 38–126)
Anion gap: 9 (ref 5–15)
BUN: 26 mg/dL — ABNORMAL HIGH (ref 8–23)
CO2: 28 mmol/L (ref 22–32)
Calcium: 8.7 mg/dL — ABNORMAL LOW (ref 8.9–10.3)
Chloride: 106 mmol/L (ref 98–111)
Creatinine: 1.4 mg/dL — ABNORMAL HIGH (ref 0.61–1.24)
GFR, Estimated: 51 mL/min — ABNORMAL LOW (ref >60)
Glucose, Bld: 117 mg/dL — ABNORMAL HIGH (ref 70–99)
Potassium: 4 mmol/L (ref 3.5–5.1)
Sodium: 143 mmol/L (ref 135–145)
Total Bilirubin: 0.5 mg/dL (ref 0.0–1.2)
Total Protein: 6.3 g/dL — ABNORMAL LOW (ref 6.5–8.1)

## 2023-04-19 LAB — CBC WITH DIFFERENTIAL (CANCER CENTER ONLY)
Abs Immature Granulocytes: 0.01 10*3/uL (ref 0.00–0.07)
Basophils Absolute: 0 10*3/uL (ref 0.0–0.1)
Basophils Relative: 0 %
Eosinophils Absolute: 0.1 10*3/uL (ref 0.0–0.5)
Eosinophils Relative: 2 %
HCT: 38.5 % — ABNORMAL LOW (ref 39.0–52.0)
Hemoglobin: 12.7 g/dL — ABNORMAL LOW (ref 13.0–17.0)
Immature Granulocytes: 0 %
Lymphocytes Relative: 24 %
Lymphs Abs: 1 10*3/uL (ref 0.7–4.0)
MCH: 35.2 pg — ABNORMAL HIGH (ref 26.0–34.0)
MCHC: 33 g/dL (ref 30.0–36.0)
MCV: 106.6 fL — ABNORMAL HIGH (ref 80.0–100.0)
Monocytes Absolute: 0.3 10*3/uL (ref 0.1–1.0)
Monocytes Relative: 8 %
Neutro Abs: 2.7 10*3/uL (ref 1.7–7.7)
Neutrophils Relative %: 66 %
Platelet Count: 165 10*3/uL (ref 150–400)
RBC: 3.61 MIL/uL — ABNORMAL LOW (ref 4.22–5.81)
RDW: 12.1 % (ref 11.5–15.5)
WBC Count: 4.1 10*3/uL (ref 4.0–10.5)
nRBC: 0 % (ref 0.0–0.2)

## 2023-04-20 LAB — BETA 2 MICROGLOBULIN, SERUM: Beta-2 Microglobulin: 1.5 mg/L (ref 0.6–2.4)

## 2023-04-22 ENCOUNTER — Other Ambulatory Visit: Payer: Self-pay

## 2023-04-22 DIAGNOSIS — D539 Nutritional anemia, unspecified: Secondary | ICD-10-CM | POA: Insufficient documentation

## 2023-04-22 DIAGNOSIS — D472 Monoclonal gammopathy: Secondary | ICD-10-CM

## 2023-04-22 LAB — KAPPA/LAMBDA LIGHT CHAINS
Kappa free light chain: 20.7 mg/L — ABNORMAL HIGH (ref 3.3–19.4)
Kappa, lambda light chain ratio: 0.11 — ABNORMAL LOW (ref 0.26–1.65)
Lambda free light chains: 190.1 mg/L — ABNORMAL HIGH (ref 5.7–26.3)

## 2023-04-23 LAB — MULTIPLE MYELOMA PANEL, SERUM
Albumin SerPl Elph-Mcnc: 3.8 g/dL (ref 2.9–4.4)
Albumin/Glob SerPl: 1.6 (ref 0.7–1.7)
Alpha 1: 0.2 g/dL (ref 0.0–0.4)
Alpha2 Glob SerPl Elph-Mcnc: 0.5 g/dL (ref 0.4–1.0)
B-Globulin SerPl Elph-Mcnc: 0.8 g/dL (ref 0.7–1.3)
Gamma Glob SerPl Elph-Mcnc: 0.9 g/dL (ref 0.4–1.8)
Globulin, Total: 2.4 g/dL (ref 2.2–3.9)
IgA: 96 mg/dL (ref 61–437)
IgG (Immunoglobin G), Serum: 1046 mg/dL (ref 603–1613)
IgM (Immunoglobulin M), Srm: 25 mg/dL (ref 15–143)
Total Protein ELP: 6.2 g/dL (ref 6.0–8.5)

## 2023-04-25 LAB — IFE+PROTEIN ELECTRO, 24-HR UR
% BETA, Urine: 12.8 %
ALPHA 1 URINE: 1.9 %
Albumin, U: 24.6 %
Alpha 2, Urine: 6.9 %
GAMMA GLOBULIN URINE: 53.8 %
M-SPIKE %, Urine: 40.5 % — ABNORMAL HIGH
M-Spike, Mg/24 Hr: 45 mg/(24.h) — ABNORMAL HIGH
Total Protein, Urine-Ur/day: 111 mg/(24.h) (ref 30–150)
Total Protein, Urine: 20.2 mg/dL
Total Volume: 550

## 2023-04-26 ENCOUNTER — Encounter: Payer: Self-pay | Admitting: Oncology

## 2023-04-26 ENCOUNTER — Inpatient Hospital Stay: Payer: Medicare HMO | Attending: Oncology | Admitting: Oncology

## 2023-04-26 VITALS — BP 128/81 | HR 66 | Temp 96.0°F | Resp 18 | Wt 150.6 lb

## 2023-04-26 DIAGNOSIS — D472 Monoclonal gammopathy: Secondary | ICD-10-CM

## 2023-04-26 DIAGNOSIS — D539 Nutritional anemia, unspecified: Secondary | ICD-10-CM

## 2023-04-26 NOTE — Assessment & Plan Note (Addendum)
 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  Bone marrow biopsy showed 8% plasma cells, normal cytogenetics, negative congo red staining.  T (11;14), negative skeletal survey, no anemia, kidney function is worse.  Lab Results  Component Value Date   MPROTEIN Not Observed 04/19/2023   KPAFRELGTCHN 20.7 (H) 04/19/2023   LAMBDASER 190.1 (H) 04/19/2023   KAPLAMBRATIO 0.11 (L) 04/19/2023   24 hour UPEP M protein 45mg  Kidney function is worse. Encourage oral hydration and avoid nephrotoxins.  Check PET scan I recommend close monitor and repeat levels in 3 months.

## 2023-04-28 NOTE — Progress Notes (Signed)
 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  Bone marrow biopsy showed 8% plasma cells, normal cytogenetics, negative congo red staining.  T (11;14), negative skeletal survey, no anemia, kidney function is worse.  Lab Results  Component Value Date   MPROTEIN Not Observed 04/19/2023   KPAFRELGTCHN 20.7 (H) 04/19/2023   LAMBDASER 190.1 (H) 04/19/2023   KAPLAMBRATIO 0.11 (L) 04/19/2023   24 hour UPEP M protein 45mg  Kidney function is worse. Encourage oral hydration and avoid nephrotoxins.  Check PET scan I recommend close monitor and repeat levels in 3 months.      Anemia, macrocytic Normal vitamin B12 level and folate level. Due to monoclonal gammopathy Monitor    Orders Placed This Encounter  Procedures   NM PET Image Initial (PI) Whole Body    Standing Status:   Future    Expected Date:   05/03/2023    Expiration Date:   04/25/2024    If indicated for the ordered procedure, I authorize the administration of a radiopharmaceutical per Radiology protocol:   Yes    Preferred imaging location?:   [facility]   CBC with Differential (Cancer Center Only)    Standing Status:   Future    Expected Date:   07/27/2023    Expiration Date:   04/25/2024   CMP (Cancer Center only)    Standing Status:   Future    Expected Date:   07/27/2023    Expiration Date:   04/25/2024   Multiple Myeloma Panel (SPEP&IFE w/QIG)    Standing Status:   Future    Expected Date:   07/27/2023    Expiration Date:   04/25/2024   Kappa/lambda light chains    Standing Status:   Future     Expected Date:   07/27/2023    Expiration Date:   04/25/2024   Beta 2 microglobulin, serum    Standing Status:   Future    Expected Date:   07/27/2023    Expiration Date:   04/25/2024   IFE+PROTEIN ELECTRO, 24-HR UR    Standing Status:   Future    Expected Date:   07/27/2023    Expiration Date:   04/25/2024   # follow-up 3 months.  All questions were answered. The patient knows to call the clinic with any problems, questions or concerns.  Rickard Patience, MD, PhD [facility] Hematology Oncology 04/26/2023     HISTORY OF PRESENTING ILLNESS:   Patient had blood work done on 09/20/2021, CBC showed white count of 3.7, differential shows lymphocytopenia.  Hemoglobin 13.4, MCV 104.6.  Vitamin B12 265.  Patient received 4 doses of vitamin B12 injections 02/02/2022 repeat B12 level >1500.  TSH was normal.  CBC shows persistent leukopenia, predominantly lymphocytopenia.  Persistent mild decrease of hemoglobin of 13.3, MCV 106. Patient denies any routine alcohol use.  Denies any recent infection He has lost weight, February 2023, he weighed 159 pounds.  Currently he weighs 140 pounds.  Appetite is fair and wife reports patient is eating his usual portion of food.  Denies night sweats or fever. Denies any over-the-counter supplement intake.  INTERVAL HISTORY Brendan Carter is a 78 y.o. male who has above history reviewed by me today presents for follow up visit for light chain MGUS No new complaints. Denies bone pain, frequent infections.  MEDICAL HISTORY:  Past Medical History:  Diagnosis Date   BPH (benign prostatic hyperplasia)    Elevated PSA    Hydrocele    Hypertension    Inguinal hernia    left   Prostatitis    Weak urinary stream     SURGICAL HISTORY: Past Surgical History:  Procedure Laterality Date   HERNIA REPAIR Right    hydrocelectomy Right 2013    SOCIAL HISTORY: Social History   Socioeconomic History   Marital status: Single    Spouse name: Not on file   Number of children:  Not on file   Years of education: Not on file   Highest education level: Not on file  Occupational History   Not on file  Tobacco Use   Smoking status: Never    Passive exposure: Never   Smokeless tobacco: Never  Substance and Sexual Activity   Alcohol use: No    Alcohol/week: 0.0 standard drinks of alcohol   Drug use: No   Sexual activity: Not on file  Other Topics Concern   Not on file  Social History Narrative   Not on file   Social Drivers of Health   Financial Resource Strain: Low Risk  (04/19/2023)   Received from [facility]   Overall Financial Resource Strain (CARDIA)    Difficulty of Paying Living Expenses: Not hard at all  Food Insecurity: No Food Insecurity (04/19/2023)   Received from [facility]   Hunger Vital Sign    Worried About Running Out of Food in the Last Year: Never true    Ran Out of Food in the Last Year: Never true  Transportation Needs: No Transportation Needs (04/19/2023)   Received from [facility] - Transportation    In the past 12 months, has lack of transportation kept you from medical appointments or from getting medications?: No    Lack of Transportation (Non-Medical): No  Physical Activity: Not on file  Stress: Not on file  Social Connections: Not on file  Intimate Partner Violence: Not on file    FAMILY HISTORY: Family History  Problem Relation Age of Onset   Kidney cancer Neg Hx    Kidney disease Neg Hx    Prostate cancer Neg Hx     ALLERGIES:  has no known allergies.  MEDICATIONS:  Current Outpatient Medications  Medication Sig Dispense Refill   carbidopa-levodopa (SINEMET IR) 25-100 MG tablet Take by mouth.     donepezil (ARICEPT) 5 MG tablet Take 1 tablet by mouth at bedtime.     finasteride (PROSCAR) 5 MG tablet Take 1 tablet by mouth once daily 90 tablet 0   furosemide (LASIX) 20 MG tablet Take 1 tablet by mouth every other day.     mirtazapine (REMERON) 30  MG tablet Take by mouth.     nitroGLYCERIN (NITROSTAT) 0.4 MG SL tablet Place under the tongue. (Patient not taking: Reported on 04/26/2023)     No current facility-administered medications for this visit.    Review of Systems  Constitutional:  Negative for appetite change, chills, fatigue, fever and unexpected weight change.  HENT:   Negative for hearing loss and voice change.   Eyes:  Negative for eye problems and icterus.  Respiratory:  Negative for  chest tightness, cough and shortness of breath.   Cardiovascular:  Negative for chest pain and leg swelling.  Gastrointestinal:  Negative for abdominal distention and abdominal pain.  Endocrine: Negative for hot flashes.  Genitourinary:  Negative for difficulty urinating, dysuria and frequency.   Musculoskeletal:  Negative for arthralgias.  Skin:  Negative for itching and rash.  Neurological:  Negative for light-headedness and numbness.  Hematological:  Negative for adenopathy. Does not bruise/bleed easily.  Psychiatric/Behavioral:  Negative for confusion.     PHYSICAL EXAMINATION: ECOG PERFORMANCE STATUS: 0 - Asymptomatic Vitals:   04/26/23 1156  BP: 128/81  Pulse: 66  Resp: 18  Temp: (!) 96 F (35.6 C)  SpO2: 100%   Filed Weights   04/26/23 1156  Weight: 150 lb 9.6 oz (68.3 kg)    Physical Exam Constitutional:      General: He is not in acute distress. HENT:     Head: Normocephalic.  Eyes:     General: No scleral icterus. Cardiovascular:     Rate and Rhythm: Normal rate.  Pulmonary:     Effort: Pulmonary effort is normal. No respiratory distress.  Abdominal:     General: Bowel sounds are normal. There is no distension.     Palpations: Abdomen is soft.  Musculoskeletal:        General: No deformity. Normal range of motion.     Cervical back: Normal range of motion and neck supple.  Skin:    Findings: No rash.  Neurological:     Mental Status: He is alert and oriented to person, place, and time. Mental status is  at baseline.     Cranial Nerves: No cranial nerve deficit.  Psychiatric:        Mood and Affect: Mood normal.      LABORATORY DATA:  I have reviewed the data as listed     Latest Ref Rng & Units 04/19/2023    2:15 PM 11/30/2022   10:36 AM 08/10/2022   10:10 AM  CBC  WBC 4.0 - 10.5 K/uL 4.1  3.2  3.5   Hemoglobin 13.0 - 17.0 g/dL 16.1  09.6  04.5   Hematocrit 39.0 - 52.0 % 38.5  42.3  40.3   Platelets 150 - 400 K/uL 165  180  157       Latest Ref Rng & Units 04/19/2023    2:15 PM 11/30/2022   10:36 AM 08/10/2022   10:10 AM  CMP  Glucose 70 - 99 mg/dL 409  811  77   BUN 8 - 23 mg/dL 26  19  20    Creatinine 0.61 - 1.24 mg/dL 9.14  7.82  9.56   Sodium 135 - 145 mmol/L 143  138  140   Potassium 3.5 - 5.1 mmol/L 4.0  4.0  3.9   Chloride 98 - 111 mmol/L 106  105  103   CO2 22 - 32 mmol/L 28  28  29    Calcium 8.9 - 10.3 mg/dL 8.7  9.3  9.2   Total Protein 6.5 - 8.1 g/dL 6.3  7.5  6.9   Total Bilirubin 0.0 - 1.2 mg/dL 0.5  0.6  0.4   Alkaline Phos 38 - 126 U/L 66  81  77   AST 15 - 41 U/L 24  23  20    ALT 0 - 44 U/L 17  19  14       RADIOGRAPHIC STUDIES: I have personally reviewed the radiological images as listed and agreed with the findings in  the report. US Venous Img Lower Unilateral Left (DVT) Result Date: 01/29/2023 CLINICAL DATA:  Left lower extremity edema. EXAM: LEFT LOWER EXTREMITY VENOUS DOPPLER ULTRASOUND TECHNIQUE: Gray-scale sonography with graded compression, as well as color Doppler and duplex ultrasound were performed to evaluate the lower extremity deep venous systems from the level of the common femoral vein and including the common femoral, femoral, profunda femoral, popliteal and calf veins including the posterior tibial, peroneal and gastrocnemius veins when visible. The superficial great saphenous vein was also interrogated. Spectral Doppler was utilized to evaluate flow at rest and with distal augmentation maneuvers in the common femoral, femoral and popliteal  veins. COMPARISON:  None Available. FINDINGS: Contralateral Common Femoral Vein: Respiratory phasicity is normal and symmetric with the symptomatic side. No evidence of thrombus. Normal compressibility. Common Femoral Vein: No evidence of thrombus. Normal compressibility, respiratory phasicity and response to augmentation. Saphenofemoral Junction: No evidence of thrombus. Normal compressibility and flow on color Doppler imaging. Profunda Femoral Vein: No evidence of thrombus. Normal compressibility and flow on color Doppler imaging. Femoral Vein: No evidence of thrombus. Normal compressibility, respiratory phasicity and response to augmentation. Popliteal Vein: No evidence of thrombus. Normal compressibility, respiratory phasicity and response to augmentation. Calf Veins: No evidence of thrombus. Normal compressibility and flow on color Doppler imaging. Superficial Great Saphenous Vein: No evidence of thrombus. Normal compressibility. Venous Reflux:  None. Other Findings: No evidence of superficial thrombophlebitis or abnormal fluid collection. IMPRESSION: No evidence of left lower extremity deep venous thrombosis. Electronically Signed   By: Irish Lack M.D.   On: 01/29/2023 15:47

## 2023-04-28 NOTE — Assessment & Plan Note (Signed)
Normal vitamin B12 level and folate level. Due to monoclonal gammopathy Monitor  

## 2023-05-01 ENCOUNTER — Ambulatory Visit

## 2023-05-01 ENCOUNTER — Ambulatory Visit: Payer: Medicare HMO | Admitting: Urology

## 2023-05-01 VITALS — BP 117/75 | HR 76

## 2023-05-01 DIAGNOSIS — N401 Enlarged prostate with lower urinary tract symptoms: Secondary | ICD-10-CM

## 2023-05-01 DIAGNOSIS — R3912 Poor urinary stream: Secondary | ICD-10-CM

## 2023-05-01 DIAGNOSIS — D472 Monoclonal gammopathy: Secondary | ICD-10-CM | POA: Diagnosis not present

## 2023-05-01 DIAGNOSIS — Z87898 Personal history of other specified conditions: Secondary | ICD-10-CM | POA: Diagnosis not present

## 2023-05-01 LAB — BLADDER SCAN AMB NON-IMAGING: Scan Result: 18

## 2023-05-01 NOTE — Progress Notes (Signed)
 Marcelle Overlie Plume,acting as a scribe for Vanna Scotland, MD.,have documented all relevant documentation on the behalf of Vanna Scotland, MD,as directed by  Vanna Scotland, MD while in the presence of Vanna Scotland, MD.  05/01/23 3:49 PM   Brendan Carter 1945-10-26 [id number]  Referring provider: Mick Sell, MD [address] [place],  [place] [address]  Chief Complaint  Patient presents with   Follow-up    HPI: 77 y/o male last seen by me a little over a year ago. He has personal history of elevated PSA and BPH with massive prostatomegaly.   He has undergone multiple negative biopsies in the context of rising PSA levels. He had a prostate MRI in 2021 which showed a PI-RADS 3 lesion at the right posterior transition zone. At that time, his prostate volume was 290. He is chronically on Finasteride.   He is scheduled for a PET scan in two days for further evaluation of his condition, specifically for multiclonal gammopathy.   His most recent PSA on 03/20/2023 was 14.8, which is stable from last year.   He reports voiding well today, symptoms are well controlled, and he is emptying well.    Prostate Specific Ag, Serum  Latest Ref Rng 0.0 - 4.0 ng/mL  09/23/2018 10.0 (H)   04/07/2019 13.4 (H)   09/14/2019 9.7 (H)   03/18/2020 8.3 (H)   03/20/2021 11.4 (H)   03/22/2022 14.9 (H)   03/20/2023 14.8 (H)     Results for orders placed or performed in visit on 05/01/23  BLADDER SCAN AMB NON-IMAGING  Result Value Ref Range   Scan Result 18 ml     IPSS     Row Name 05/01/23 1400         International Prostate Symptom Score   How often have you had the sensation of not emptying your bladder? Not at All     How often have you had to urinate less than every two hours? Less than 1 in 5 times     How often have you found you stopped and started again several times when you urinated? Less than 1 in 5 times     How often have you found it difficult to postpone urination? Not at  All     How often have you had a weak urinary stream? Not at All     How often have you had to strain to start urination? Not at All     How many times did you typically get up at night to urinate? None     Total IPSS Score 2       Quality of Life due to urinary symptoms   If you were to spend the rest of your life with your urinary condition just the way it is now how would you feel about that? Mixed              Score:  1-7 Mild 8-19 Moderate 20-35 Severe   PMH: Past Medical History:  Diagnosis Date   BPH (benign prostatic hyperplasia)    Elevated PSA    Hydrocele    Hypertension    Inguinal hernia    left   Prostatitis    Weak urinary stream     Surgical History: Past Surgical History:  Procedure Laterality Date   HERNIA REPAIR Right    hydrocelectomy Right 2013    Home Medications:  Allergies as of 05/01/2023   No Known Allergies  Medication List        Accurate as of May 01, 2023  3:49 PM. If you have any questions, ask your nurse or doctor.          carbidopa-levodopa 25-100 MG tablet Commonly known as: SINEMET IR Take by mouth.   donepezil 5 MG tablet Commonly known as: ARICEPT Take 1 tablet by mouth at bedtime.   finasteride 5 MG tablet Commonly known as: PROSCAR Take 1 tablet by mouth once daily   furosemide 20 MG tablet Commonly known as: LASIX Take 1 tablet by mouth every other day.   mirtazapine 30 MG tablet Commonly known as: REMERON Take by mouth.   nitroGLYCERIN 0.4 MG SL tablet Commonly known as: NITROSTAT Place under the tongue.        Family History: Family History  Problem Relation Age of Onset   Kidney cancer Neg Hx    Kidney disease Neg Hx    Prostate cancer Neg Hx     Social History:  reports that he has never smoked. He has never been exposed to tobacco smoke. He has never used smokeless tobacco. He reports that he does not drink alcohol and does not use drugs.   Physical Exam: BP 117/75    Pulse 76   Constitutional:  Alert and oriented, No acute distress. HEENT: [place] AT, moist mucus membranes.  Trachea midline, no masses. Neurologic: Grossly intact, no focal deficits, moving all 4 extremities. Psychiatric: Normal mood and affect.   Assessment & Plan:    1. History of elevated PSA - Most recent PSA in January was 14.8, which is stable from last year.  - Given the stability and the patient's age and comorbidities, a digital rectal exam is deferred. The elevated PSA is attributed to his significantly enlarged prostate, which is consistent with BPH rather than prostate cancer.   2. BPH with LUTS - He is currently on Finasteride, which is effectively controlling his lower urinary tract symptoms - He is voiding well - Advised to continue Finasteride as it is helping manage his symptoms and preventing further prostate enlargement.  3. Gammopathy/ light-chain MGUS - The patient has a diagnosis of light-chain MGUS; patient is scheduled for a PET scan for further evaluation.  Will follow this if any prostate enhancement is seen  Return in about 1 year (around 04/30/2024) for repeat PSA.  I have reviewed the above documentation for accuracy and completeness, and I agree with the above.   Vanna Scotland, MD    [facility] [address], [address] [place], [place] [address] [phone]

## 2023-05-03 ENCOUNTER — Encounter
Admission: RE | Admit: 2023-05-03 | Discharge: 2023-05-03 | Disposition: A | Discharge status: Home / Self Care | Source: Ambulatory Visit | Attending: Oncology | Admitting: Oncology

## 2023-05-03 ENCOUNTER — Ambulatory Visit

## 2023-05-03 DIAGNOSIS — R9439 Abnormal result of other cardiovascular function study: Secondary | ICD-10-CM | POA: Diagnosis not present

## 2023-05-03 DIAGNOSIS — R911 Solitary pulmonary nodule: Secondary | ICD-10-CM | POA: Diagnosis not present

## 2023-05-03 DIAGNOSIS — D472 Monoclonal gammopathy: Secondary | ICD-10-CM | POA: Diagnosis not present

## 2023-05-03 LAB — GLUCOSE, CAPILLARY: Glucose-Capillary: 96 mg/dL (ref 70–99)

## 2023-05-03 MED ORDER — FLUDEOXYGLUCOSE F - 18 (FDG) INJECTION
8.1100 | Freq: Once | INTRAVENOUS | Status: AC | PRN
  Administered 2023-05-03: 8.11 via INTRAVENOUS

## 2023-05-21 DIAGNOSIS — R413 Other amnesia: Secondary | ICD-10-CM | POA: Diagnosis not present

## 2023-05-21 DIAGNOSIS — G20C Parkinsonism, unspecified: Secondary | ICD-10-CM | POA: Diagnosis not present

## 2023-05-21 DIAGNOSIS — G479 Sleep disorder, unspecified: Secondary | ICD-10-CM | POA: Diagnosis not present

## 2023-05-21 DIAGNOSIS — F0280 Dementia in other diseases classified elsewhere without behavioral disturbance: Secondary | ICD-10-CM | POA: Diagnosis not present

## 2023-05-21 DIAGNOSIS — G309 Alzheimer's disease, unspecified: Secondary | ICD-10-CM | POA: Diagnosis not present

## 2023-06-05 DIAGNOSIS — F05 Delirium due to known physiological condition: Secondary | ICD-10-CM | POA: Diagnosis not present

## 2023-06-05 DIAGNOSIS — G250 Essential tremor: Secondary | ICD-10-CM | POA: Diagnosis not present

## 2023-06-05 DIAGNOSIS — R454 Irritability and anger: Secondary | ICD-10-CM | POA: Diagnosis not present

## 2023-06-05 DIAGNOSIS — R413 Other amnesia: Secondary | ICD-10-CM | POA: Diagnosis not present

## 2023-06-05 DIAGNOSIS — G479 Sleep disorder, unspecified: Secondary | ICD-10-CM | POA: Diagnosis not present

## 2023-06-13 DIAGNOSIS — G479 Sleep disorder, unspecified: Secondary | ICD-10-CM | POA: Diagnosis not present

## 2023-06-13 DIAGNOSIS — R454 Irritability and anger: Secondary | ICD-10-CM | POA: Diagnosis not present

## 2023-06-14 ENCOUNTER — Other Ambulatory Visit: Payer: Self-pay | Admitting: Urology

## 2023-06-14 DIAGNOSIS — N400 Enlarged prostate without lower urinary tract symptoms: Secondary | ICD-10-CM

## 2023-07-12 ENCOUNTER — Ambulatory Visit (INDEPENDENT_AMBULATORY_CARE_PROVIDER_SITE_OTHER): Admitting: Urology

## 2023-07-12 ENCOUNTER — Encounter: Payer: Self-pay | Admitting: Urology

## 2023-07-12 VITALS — BP 143/83 | HR 53

## 2023-07-12 DIAGNOSIS — R3912 Poor urinary stream: Secondary | ICD-10-CM | POA: Diagnosis not present

## 2023-07-12 DIAGNOSIS — R972 Elevated prostate specific antigen [PSA]: Secondary | ICD-10-CM

## 2023-07-12 DIAGNOSIS — N401 Enlarged prostate with lower urinary tract symptoms: Secondary | ICD-10-CM

## 2023-07-12 LAB — BLADDER SCAN AMB NON-IMAGING: Scan Result: 2

## 2023-07-12 NOTE — Progress Notes (Signed)
 07/12/2023 11:22 AM   Brendan Carter 1945/07/20 [id number]  Referring provider: Eartha Gold, MD [address] [place],  [place] [address]  Urological history: 1.  Elevated PSA  Prostate Specific Ag, Serum  Latest Ref Rng 0.0 - 4.0 ng/mL  09/23/2018 10.0 (H)   04/07/2019 13.4 (H)   09/14/2019 9.7 (H)   03/18/2020 8.3 (H)   03/20/2021 11.4 (H)   03/22/2022 14.9 (H)   03/20/2023 14.8 (H)     Legend: (H) High  2. BPH with LU TS - prostate volume (2025 PET scan) 256.24 cc  - finasteride  5 mg daily   3. 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 Hydrocele - PET scan (04/2023) - large left scrotal hydrocele  Chief Complaint  Patient presents with   Follow-up   HPI: Brendan Carter is a 78 y.o. man who presents today for follow-up after it was found to have some asymmetric uptake in his prostate on PET scan with his friend, Roanna Chew.   Previous records reviewed.   He has moderate dementia, so history and decision making is assisted by his friend, Roanna Chew.    Patient underwent PET scan on May 03, 2023 for follow up on MGUS and he was found to have a very large prostate with a peripheral area of asymmetric uptake posteriorly centered just midline right of the midline.  I PSS 1/2  PVR 2 mL  He has no urinary complaints.  Patient denies any modifying or aggravating factors.  Patient denies any recent UTI's, gross hematuria, dysuria or suprapubic/flank pain.  Patient denies any fevers, chills, nausea or vomiting.     IPSS     Row Name 07/12/23 0900         International Prostate Symptom Score   How often have you had the sensation of not emptying your bladder? Not at All     How often have you had to urinate less than every two hours? Less than 1 in 5 times     How often have you found you stopped and started again several times when you urinated? Not at All     How often have you found it difficult to postpone urination? Not at  All     How often have you had a weak urinary stream? Not at All     How often have you had to strain to start urination? Not at All     How many times did you typically get up at night to urinate? None     Total IPSS Score 1       Quality of Life due to urinary symptoms   If you were to spend the rest of your life with your urinary condition just the way it is now how would you feel about that? Mostly Satisfied              Score:  1-7 Mild 8-19 Moderate 20-35 Severe    PMH: Past Medical History:  Diagnosis Date   BPH (benign prostatic hyperplasia)    Elevated PSA    Hydrocele    Hypertension    Inguinal hernia    left   Prostatitis    Weak urinary stream     Surgical History: Past Surgical History:  Procedure Laterality Date   HERNIA REPAIR Right    hydrocelectomy Right 2013    Home Medications:  Allergies as of 07/12/2023   No Known Allergies  Medication List        Accurate as of Jul 12, 2023 11:22 AM. If you have any questions, ask your nurse or doctor.          STOP taking these medications    donepezil 5 MG tablet Commonly known as: ARICEPT   furosemide 20 MG tablet Commonly known as: LASIX   mirtazapine 30 MG tablet Commonly known as: REMERON       TAKE these medications    carbidopa-levodopa 25-100 MG tablet Commonly known as: SINEMET IR Take by mouth.   finasteride  5 MG tablet Commonly known as: PROSCAR  Take 1 tablet by mouth once daily   nitroGLYCERIN 0.4 MG SL tablet Commonly known as: NITROSTAT Place under the tongue.   QUEtiapine 25 MG tablet Commonly known as: SEROQUEL Take 25 mg by mouth.   traZODone 50 MG tablet Commonly known as: DESYREL Take 50-100 mg by mouth at bedtime.        Allergies: No Known Allergies  Family History: Family History  Problem Relation Age of Onset   Kidney cancer Neg Hx    Kidney disease Neg Hx    Prostate cancer Neg Hx     Social History:  reports that he has never  smoked. He has never been exposed to tobacco smoke. He has never used smokeless tobacco. He reports that he does not drink alcohol and does not use drugs.  ROS: Pertinent ROS in HPI  Physical Exam: BP (!) 143/83   Pulse (!) 53   Constitutional:  Well nourished. Alert and oriented, No acute distress. HEENT: [place] AT, moist mucus membranes.  Trachea midline Cardiovascular: No clubbing, cyanosis, or edema. Respiratory: Normal respiratory effort, no increased work of breathing. Neurologic: Grossly intact, no focal deficits, moving all 4 extremities. Psychiatric: Normal mood and affect.  Laboratory Data: Lab Results  Component Value Date   WBC 4.1 04/19/2023   HGB 12.7 (L) 04/19/2023   HCT 38.5 (L) 04/19/2023   MCV 106.6 (H) 04/19/2023   PLT 165 04/19/2023   Lab Results  Component Value Date   CREATININE 1.40 (H) 04/19/2023   Lab Results  Component Value Date   AST 24 04/19/2023   Lab Results  Component Value Date   ALT 17 04/19/2023  I have reviewed the labs.   Pertinent Imaging:  07/12/23 09:14  Scan Result 2 mL   Assessment & Plan:    1. Elevated PSA - PSA pending  - history of massive BPH - PSA's have been relatively stable over the last 4 years - discussed the findings of the PET scan concerning the asymmetric uptake in the prostate which may indicate prostate cancer.  In light of his moderate dementia, age and other co morbidities, Roanna Chew and the patient would like to see what the PSA is from today and if there has been no significant changes, continue to monitor for symptoms - if there is a significant increase in the PSA, they would like to pursue a prostate MRI   2. Benign prostatic hyperplasia with weak urinary stream (Primary) - BLADDER SCAN AMB NON-IMAGING, demonstrates adequate bladder emptying  - continue finasteride  5 mg daily  Return for pending psa results .  These notes generated with voice recognition software. I apologize for typographical  errors.  Briant Camper  [facility] [address]  [address] [place], [place] [address] [phone]

## 2023-07-13 LAB — PSA: Prostate Specific Ag, Serum: 11.5 ng/mL — ABNORMAL HIGH (ref 0.0–4.0)

## 2023-07-15 ENCOUNTER — Ambulatory Visit: Payer: Self-pay | Admitting: Urology

## 2023-07-16 ENCOUNTER — Telehealth: Payer: Self-pay | Admitting: Urology

## 2023-07-16 NOTE — Telephone Encounter (Signed)
 recheck his PSA in 6 months

## 2023-07-19 DIAGNOSIS — Z0000 Encounter for general adult medical examination without abnormal findings: Secondary | ICD-10-CM | POA: Diagnosis not present

## 2023-07-19 DIAGNOSIS — Z1331 Encounter for screening for depression: Secondary | ICD-10-CM | POA: Diagnosis not present

## 2023-07-19 DIAGNOSIS — I509 Heart failure, unspecified: Secondary | ICD-10-CM | POA: Diagnosis not present

## 2023-07-19 DIAGNOSIS — N400 Enlarged prostate without lower urinary tract symptoms: Secondary | ICD-10-CM | POA: Diagnosis not present

## 2023-07-19 DIAGNOSIS — F0390 Unspecified dementia without behavioral disturbance: Secondary | ICD-10-CM | POA: Diagnosis not present

## 2023-07-19 DIAGNOSIS — E8809 Other disorders of plasma-protein metabolism, not elsewhere classified: Secondary | ICD-10-CM | POA: Diagnosis not present

## 2023-07-23 DIAGNOSIS — R454 Irritability and anger: Secondary | ICD-10-CM | POA: Diagnosis not present

## 2023-07-23 DIAGNOSIS — G309 Alzheimer's disease, unspecified: Secondary | ICD-10-CM | POA: Diagnosis not present

## 2023-07-23 DIAGNOSIS — F02C3 Dementia in other diseases classified elsewhere, severe, with mood disturbance: Secondary | ICD-10-CM | POA: Diagnosis not present

## 2023-07-23 DIAGNOSIS — G250 Essential tremor: Secondary | ICD-10-CM | POA: Diagnosis not present

## 2023-07-23 DIAGNOSIS — I679 Cerebrovascular disease, unspecified: Secondary | ICD-10-CM | POA: Diagnosis not present

## 2023-07-23 DIAGNOSIS — G479 Sleep disorder, unspecified: Secondary | ICD-10-CM | POA: Diagnosis not present

## 2023-07-23 DIAGNOSIS — F05 Delirium due to known physiological condition: Secondary | ICD-10-CM | POA: Diagnosis not present

## 2023-07-26 ENCOUNTER — Inpatient Hospital Stay: Attending: Oncology

## 2023-07-26 DIAGNOSIS — D539 Nutritional anemia, unspecified: Secondary | ICD-10-CM | POA: Insufficient documentation

## 2023-07-26 DIAGNOSIS — I517 Cardiomegaly: Secondary | ICD-10-CM | POA: Diagnosis not present

## 2023-07-26 DIAGNOSIS — N400 Enlarged prostate without lower urinary tract symptoms: Secondary | ICD-10-CM | POA: Diagnosis not present

## 2023-07-26 DIAGNOSIS — D472 Monoclonal gammopathy: Secondary | ICD-10-CM | POA: Insufficient documentation

## 2023-07-26 DIAGNOSIS — R634 Abnormal weight loss: Secondary | ICD-10-CM | POA: Diagnosis not present

## 2023-07-26 LAB — CBC WITH DIFFERENTIAL (CANCER CENTER ONLY)
Abs Immature Granulocytes: 0.01 10*3/uL (ref 0.00–0.07)
Basophils Absolute: 0 10*3/uL (ref 0.0–0.1)
Basophils Relative: 0 %
Eosinophils Absolute: 0 10*3/uL (ref 0.0–0.5)
Eosinophils Relative: 1 %
HCT: 37.3 % — ABNORMAL LOW (ref 39.0–52.0)
Hemoglobin: 12.1 g/dL — ABNORMAL LOW (ref 13.0–17.0)
Immature Granulocytes: 0 %
Lymphocytes Relative: 20 %
Lymphs Abs: 0.7 10*3/uL (ref 0.7–4.0)
MCH: 34.8 pg — ABNORMAL HIGH (ref 26.0–34.0)
MCHC: 32.4 g/dL (ref 30.0–36.0)
MCV: 107.2 fL — ABNORMAL HIGH (ref 80.0–100.0)
Monocytes Absolute: 0.3 10*3/uL (ref 0.1–1.0)
Monocytes Relative: 9 %
Neutro Abs: 2.3 10*3/uL (ref 1.7–7.7)
Neutrophils Relative %: 70 %
Platelet Count: 158 10*3/uL (ref 150–400)
RBC: 3.48 MIL/uL — ABNORMAL LOW (ref 4.22–5.81)
RDW: 12.2 % (ref 11.5–15.5)
WBC Count: 3.3 10*3/uL — ABNORMAL LOW (ref 4.0–10.5)
nRBC: 0 % (ref 0.0–0.2)

## 2023-07-26 LAB — CMP (CANCER CENTER ONLY)
ALT: 12 U/L (ref 0–44)
AST: 19 U/L (ref 15–41)
Albumin: 3.8 g/dL (ref 3.5–5.0)
Alkaline Phosphatase: 76 U/L (ref 38–126)
Anion gap: 6 (ref 5–15)
BUN: 20 mg/dL (ref 8–23)
CO2: 26 mmol/L (ref 22–32)
Calcium: 8.2 mg/dL — ABNORMAL LOW (ref 8.9–10.3)
Chloride: 107 mmol/L (ref 98–111)
Creatinine: 1.18 mg/dL (ref 0.61–1.24)
GFR, Estimated: >60 mL/min (ref >60)
Glucose, Bld: 107 mg/dL — ABNORMAL HIGH (ref 70–99)
Potassium: 3.8 mmol/L (ref 3.5–5.1)
Sodium: 139 mmol/L (ref 135–145)
Total Bilirubin: 0.7 mg/dL (ref 0.0–1.2)
Total Protein: 6.4 g/dL — ABNORMAL LOW (ref 6.5–8.1)

## 2023-07-27 LAB — BETA 2 MICROGLOBULIN, SERUM: Beta-2 Microglobulin: 1.4 mg/L (ref 0.6–2.4)

## 2023-07-29 LAB — MULTIPLE MYELOMA PANEL, SERUM
Albumin SerPl Elph-Mcnc: 3.7 g/dL (ref 2.9–4.4)
Albumin/Glob SerPl: 1.7 (ref 0.7–1.7)
Alpha 1: 0.2 g/dL (ref 0.0–0.4)
Alpha2 Glob SerPl Elph-Mcnc: 0.5 g/dL (ref 0.4–1.0)
B-Globulin SerPl Elph-Mcnc: 0.8 g/dL (ref 0.7–1.3)
Gamma Glob SerPl Elph-Mcnc: 0.9 g/dL (ref 0.4–1.8)
Globulin, Total: 2.3 g/dL (ref 2.2–3.9)
IgA: 95 mg/dL (ref 61–437)
IgG (Immunoglobin G), Serum: 1001 mg/dL (ref 603–1613)
IgM (Immunoglobulin M), Srm: 21 mg/dL (ref 15–143)
Total Protein ELP: 6 g/dL (ref 6.0–8.5)

## 2023-07-29 LAB — KAPPA/LAMBDA LIGHT CHAINS
Kappa free light chain: 20.2 mg/L — ABNORMAL HIGH (ref 3.3–19.4)
Kappa, lambda light chain ratio: 0.1 — ABNORMAL LOW (ref 0.26–1.65)
Lambda free light chains: 198 mg/L — ABNORMAL HIGH (ref 5.7–26.3)

## 2023-08-01 ENCOUNTER — Other Ambulatory Visit: Payer: Self-pay

## 2023-08-01 DIAGNOSIS — D472 Monoclonal gammopathy: Secondary | ICD-10-CM

## 2023-08-01 DIAGNOSIS — R634 Abnormal weight loss: Secondary | ICD-10-CM | POA: Diagnosis not present

## 2023-08-01 DIAGNOSIS — N400 Enlarged prostate without lower urinary tract symptoms: Secondary | ICD-10-CM | POA: Diagnosis not present

## 2023-08-01 DIAGNOSIS — I517 Cardiomegaly: Secondary | ICD-10-CM | POA: Diagnosis not present

## 2023-08-01 DIAGNOSIS — D539 Nutritional anemia, unspecified: Secondary | ICD-10-CM | POA: Diagnosis not present

## 2023-08-05 ENCOUNTER — Inpatient Hospital Stay (HOSPITAL_BASED_OUTPATIENT_CLINIC_OR_DEPARTMENT_OTHER): Admitting: Oncology

## 2023-08-05 ENCOUNTER — Encounter: Payer: Self-pay | Admitting: Oncology

## 2023-08-05 VITALS — BP 128/87 | HR 62 | Temp 97.1°F | Resp 15 | Wt 154.0 lb

## 2023-08-05 DIAGNOSIS — R634 Abnormal weight loss: Secondary | ICD-10-CM | POA: Diagnosis not present

## 2023-08-05 DIAGNOSIS — D539 Nutritional anemia, unspecified: Secondary | ICD-10-CM

## 2023-08-05 DIAGNOSIS — I517 Cardiomegaly: Secondary | ICD-10-CM | POA: Insufficient documentation

## 2023-08-05 DIAGNOSIS — N400 Enlarged prostate without lower urinary tract symptoms: Secondary | ICD-10-CM

## 2023-08-05 DIAGNOSIS — D472 Monoclonal gammopathy: Secondary | ICD-10-CM | POA: Diagnosis not present

## 2023-08-05 NOTE — Assessment & Plan Note (Signed)
 Stable weight.

## 2023-08-05 NOTE — Assessment & Plan Note (Signed)
Normal vitamin B12 level and folate level. Due to monoclonal gammopathy Monitor  

## 2023-08-05 NOTE — Assessment & Plan Note (Addendum)
 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  Bone marrow biopsy showed 8% plasma cells, normal cytogenetics, negative congo red staining.  T (11;14), negative skeletal survey, no anemia, kidney function is worse.  Lab Results  Component Value Date   MPROTEIN Not Observed 07/26/2023   KPAFRELGTCHN 20.2 (H) 07/26/2023   LAMBDASER 198.0 (H) 07/26/2023   KAPLAMBRATIO 0.10 (L) 07/26/2023   24 hour UPEP M protein-45mg , today's level is pending Kidney function is worse. Encourage oral hydration and avoid nephrotoxins.  I recommend close monitor and repeat levels in 4 months.

## 2023-08-05 NOTE — Assessment & Plan Note (Signed)
 BPH, he follows up with urology

## 2023-08-05 NOTE — Progress Notes (Signed)
 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  Bone marrow biopsy showed 8% plasma cells, normal cytogenetics, negative congo red staining.  T (11;14), negative skeletal survey, no anemia, kidney function is worse.  Lab Results  Component Value Date   MPROTEIN Not Observed 07/26/2023   KPAFRELGTCHN 20.2 (H) 07/26/2023   LAMBDASER 198.0 (H) 07/26/2023   KAPLAMBRATIO 0.10 (L) 07/26/2023   24 hour UPEP M protein-45mg , today's level is pending Kidney function is worse. Encourage oral hydration and avoid nephrotoxins.  I recommend close monitor and repeat levels in 4 months.      Anemia, macrocytic Normal vitamin B12 level and folate level. Due to monoclonal gammopathy Monitor   Weight loss Stable weight   Cardiomegaly Incidental finding on PET.  Recommend patient to follow up with cardiology.   Enlarged prostate BPH, he follows up with urology   Orders Placed This Encounter  Procedures   CBC with Differential (Cancer Center Only)    Standing Status:   Future    Expected Date:   12/05/2023    Expiration Date:   03/04/2024   CMP (Cancer Center only)    Standing Status:   Future    Expected Date:   12/05/2023    Expiration Date:   03/04/2024   Kappa/lambda light chains    Standing Status:   Future    Expected Date:   12/05/2023    Expiration Date:   03/04/2024   IFE+PROTEIN ELECTRO, 24-HR UR    Standing Status:   Future    Expected Date:   12/05/2023    Expiration Date:   03/04/2024   Beta 2 microglobulin, serum    Standing Status:   Future     Expected Date:   12/05/2023    Expiration Date:   03/04/2024   Protein electrophoresis, serum    Standing Status:   Future    Expected Date:   12/05/2023    Expiration Date:   03/04/2024   IFE+PROTEIN ELECTRO, 24-HR UR    Standing Status:   Future    Expected Date:   08/12/2023    Expiration Date:   11/10/2023   # follow-up 3 months.  All questions were answered. The patient knows to call the clinic with any problems, questions or concerns.  Timmy Forbes, MD, PhD [facility] Hematology Oncology 08/05/2023     HISTORY OF PRESENTING ILLNESS:   Patient had blood work done on 09/20/2021, CBC showed white count of 3.7, differential shows lymphocytopenia.  Hemoglobin 13.4, MCV 104.6.  Vitamin B12 265.  Patient received 4 doses of vitamin B12 injections 02/02/2022 repeat B12 level >1500.  TSH was normal.  CBC shows persistent leukopenia, predominantly lymphocytopenia.  Persistent mild decrease of hemoglobin of 13.3, MCV 106. Patient denies any routine alcohol use.  Denies any recent infection He has lost weight, February 2023, he weighed 159 pounds.  Currently he weighs 140 pounds.  Appetite is fair and wife reports patient is eating his usual portion of food.  Denies night sweats or fever. Denies any over-the-counter supplement intake.   INTERVAL HISTORY Brendan Carter is a  78 y.o. male who has above history reviewed by me today presents for follow up visit for light chain MGUS No new complaints. 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  Denies bone pain, frequent infections.  MEDICAL HISTORY:  Past Medical History:  Diagnosis Date   BPH (benign prostatic hyperplasia)    Elevated PSA    Hydrocele    Hypertension    Inguinal hernia    left   Prostatitis    Weak urinary stream     SURGICAL HISTORY: Past Surgical History:  Procedure Laterality Date   HERNIA REPAIR Right    hydrocelectomy Right 2013    SOCIAL HISTORY: Social History   Socioeconomic History   Marital  status: Single    Spouse name: Not on file   Number of children: Not on file   Years of education: Not on file   Highest education level: Not on file  Occupational History   Not on file  Tobacco Use   Smoking status: Never    Passive exposure: Never   Smokeless tobacco: Never  Substance and Sexual Activity   Alcohol use: No    Alcohol/week: 0.0 standard drinks of alcohol   Drug use: No   Sexual activity: Not on file  Other Topics Concern   Not on file  Social History Narrative   Not on file   Social Drivers of Health   Financial Resource Strain: Low Risk  (07/19/2023)   Received from [facility]   Overall Financial Resource Strain (CARDIA)    Difficulty of Paying Living Expenses: Not hard at all  Food Insecurity: No Food Insecurity (07/19/2023)   Received from [facility]   Hunger Vital Sign    Within the past 12 months, you worried that your food would run out before you got the money to buy more.: Never true    Within the past 12 months, the food you bought just didn't last and you didn't have money to get more.: Never true  Transportation Needs: No Transportation Needs (07/19/2023)   Received from [facility] - Transportation    In the past 12 months, has lack of transportation kept you from medical appointments or from getting medications?: No    Lack of Transportation (Non-Medical): No  Physical Activity: Not on file  Stress: Not on file  Social Connections: Not on file  Intimate Partner Violence: Not on file    FAMILY HISTORY: Family History  Problem Relation Age of Onset   Kidney cancer Neg Hx    Kidney disease Neg Hx    Prostate cancer Neg Hx     ALLERGIES:  has no known allergies.  MEDICATIONS:  Current Outpatient Medications  Medication Sig Dispense Refill   carbidopa-levodopa (SINEMET IR) 25-100 MG tablet Take by mouth.     donepezil (ARICEPT) 10 MG tablet Take 10 mg by mouth at bedtime.      finasteride  (PROSCAR ) 5 MG tablet Take 1 tablet by mouth once daily 90 tablet 3   QUEtiapine (SEROQUEL) 25 MG tablet Take 25 mg by mouth.     traZODone (DESYREL) 50 MG tablet Take 50-100 mg by mouth at bedtime.     nitroGLYCERIN (NITROSTAT) 0.4 MG SL tablet Place under the tongue.     No current facility-administered medications for this visit.    Review of Systems  Constitutional:  Negative for appetite change, chills, fatigue, fever and unexpected weight change.  HENT:   Negative for hearing  loss and voice change.   Eyes:  Negative for eye problems and icterus.  Respiratory:  Negative for chest tightness, cough and shortness of breath.   Cardiovascular:  Negative for chest pain and leg swelling.  Gastrointestinal:  Negative for abdominal distention and abdominal pain.  Endocrine: Negative for hot flashes.  Genitourinary:  Negative for difficulty urinating, dysuria and frequency.   Musculoskeletal:  Negative for arthralgias.  Skin:  Negative for itching and rash.  Neurological:  Negative for light-headedness and numbness.  Hematological:  Negative for adenopathy. Does not bruise/bleed easily.  Psychiatric/Behavioral:  Negative for confusion.     PHYSICAL EXAMINATION: ECOG PERFORMANCE STATUS: 0 - Asymptomatic Vitals:   08/05/23 1009  BP: 128/87  Pulse: 62  Resp: 15  Temp: (!) 97.1 F (36.2 C)  SpO2: 100%   Filed Weights   08/05/23 1009  Weight: 154 lb (69.9 kg)    Physical Exam Constitutional:      General: He is not in acute distress. HENT:     Head: Normocephalic.   Eyes:     General: No scleral icterus.   Cardiovascular:     Rate and Rhythm: Normal rate.  Pulmonary:     Effort: Pulmonary effort is normal. No respiratory distress.  Abdominal:     General: Bowel sounds are normal. There is no distension.     Palpations: Abdomen is soft.   Musculoskeletal:        General: No deformity. Normal range of motion.     Cervical back: Normal range of motion and  neck supple.   Skin:    Findings: No rash.   Neurological:     Mental Status: He is alert and oriented to person, place, and time. Mental status is at baseline.     Cranial Nerves: No cranial nerve deficit.   Psychiatric:        Mood and Affect: Mood normal.      LABORATORY DATA:  I have reviewed the data as listed     Latest Ref Rng & Units 07/26/2023    9:52 AM 04/19/2023    2:15 PM 11/30/2022   10:36 AM  CBC  WBC 4.0 - 10.5 K/uL 3.3  4.1  3.2   Hemoglobin 13.0 - 17.0 g/dL 16.1  09.6  04.5   Hematocrit 39.0 - 52.0 % 37.3  38.5  42.3   Platelets 150 - 400 K/uL 158  165  180       Latest Ref Rng & Units 07/26/2023    9:52 AM 04/19/2023    2:15 PM 11/30/2022   10:36 AM  CMP  Glucose 70 - 99 mg/dL 409  811  914   BUN 8 - 23 mg/dL 20  26  19    Creatinine 0.61 - 1.24 mg/dL 7.82  9.56  2.13   Sodium 135 - 145 mmol/L 139  143  138   Potassium 3.5 - 5.1 mmol/L 3.8  4.0  4.0   Chloride 98 - 111 mmol/L 107  106  105   CO2 22 - 32 mmol/L 26  28  28    Calcium 8.9 - 10.3 mg/dL 8.2  8.7  9.3   Total Protein 6.5 - 8.1 g/dL 6.4  6.3  7.5   Total Bilirubin 0.0 - 1.2 mg/dL 0.7  0.5  0.6   Alkaline Phos 38 - 126 U/L 76  66  81   AST 15 - 41 U/L 19  24  23    ALT 0 - 44 U/L 12  17  19      RADIOGRAPHIC STUDIES: I have personally reviewed the radiological images as listed and agreed with the findings in the report. No results found.

## 2023-08-05 NOTE — Assessment & Plan Note (Signed)
 Incidental finding on PET.  Recommend patient to follow up with cardiology.

## 2023-08-06 LAB — IFE+PROTEIN ELECTRO, 24-HR UR
% BETA, Urine: 13 %
ALPHA 1 URINE: 1.8 %
Albumin, U: 15.3 %
Alpha 2, Urine: 8.9 %
GAMMA GLOBULIN URINE: 61 %
M-SPIKE %, Urine: 53.5 % — ABNORMAL HIGH
M-Spike, Mg/24 Hr: 83 mg/(24.h) — ABNORMAL HIGH
Total Protein, Urine-Ur/day: 154 mg/(24.h) — ABNORMAL HIGH (ref 30–150)
Total Protein, Urine: 61.7 mg/dL
Total Volume: 250

## 2023-10-28 ENCOUNTER — Other Ambulatory Visit
Admission: RE | Admit: 2023-10-28 | Discharge: 2023-10-28 | Disposition: A | Discharge status: Home / Self Care | Source: Ambulatory Visit

## 2023-10-28 DIAGNOSIS — M7989 Other specified soft tissue disorders: Secondary | ICD-10-CM | POA: Diagnosis not present

## 2023-10-28 LAB — D-DIMER, QUANTITATIVE: D-Dimer, Quant: 1.17 ug{FEU}/mL — ABNORMAL HIGH (ref 0.00–0.50)

## 2023-10-29 ENCOUNTER — Other Ambulatory Visit: Payer: Self-pay

## 2023-10-29 DIAGNOSIS — M7989 Other specified soft tissue disorders: Secondary | ICD-10-CM

## 2023-10-31 ENCOUNTER — Ambulatory Visit
Admission: RE | Admit: 2023-10-31 | Discharge: 2023-10-31 | Disposition: A | Discharge status: Home / Self Care | Source: Ambulatory Visit

## 2023-10-31 DIAGNOSIS — R600 Localized edema: Secondary | ICD-10-CM | POA: Diagnosis not present

## 2023-10-31 DIAGNOSIS — M7989 Other specified soft tissue disorders: Secondary | ICD-10-CM | POA: Diagnosis not present

## 2023-11-01 ENCOUNTER — Ambulatory Visit

## 2023-11-01 DIAGNOSIS — E8809 Other disorders of plasma-protein metabolism, not elsewhere classified: Secondary | ICD-10-CM | POA: Diagnosis not present

## 2023-11-01 DIAGNOSIS — M7989 Other specified soft tissue disorders: Secondary | ICD-10-CM | POA: Diagnosis not present

## 2023-11-01 DIAGNOSIS — Z125 Encounter for screening for malignant neoplasm of prostate: Secondary | ICD-10-CM | POA: Diagnosis not present

## 2023-11-01 DIAGNOSIS — I509 Heart failure, unspecified: Secondary | ICD-10-CM | POA: Diagnosis not present

## 2023-11-01 DIAGNOSIS — R7989 Other specified abnormal findings of blood chemistry: Secondary | ICD-10-CM | POA: Diagnosis not present

## 2023-11-06 ENCOUNTER — Other Ambulatory Visit: Payer: Self-pay | Admitting: Infectious Diseases

## 2023-11-06 DIAGNOSIS — N1831 Chronic kidney disease, stage 3a: Secondary | ICD-10-CM

## 2023-11-06 DIAGNOSIS — M7989 Other specified soft tissue disorders: Secondary | ICD-10-CM

## 2023-11-06 DIAGNOSIS — E8809 Other disorders of plasma-protein metabolism, not elsewhere classified: Secondary | ICD-10-CM

## 2023-11-06 DIAGNOSIS — F0280 Dementia in other diseases classified elsewhere without behavioral disturbance: Secondary | ICD-10-CM

## 2023-11-06 DIAGNOSIS — I5032 Chronic diastolic (congestive) heart failure: Secondary | ICD-10-CM

## 2023-11-06 DIAGNOSIS — R7989 Other specified abnormal findings of blood chemistry: Secondary | ICD-10-CM

## 2023-11-08 ENCOUNTER — Ambulatory Visit
Admission: RE | Admit: 2023-11-08 | Discharge: 2023-11-08 | Disposition: A | Discharge status: Home / Self Care | Source: Ambulatory Visit | Attending: Infectious Diseases | Admitting: Infectious Diseases

## 2023-11-08 DIAGNOSIS — I5032 Chronic diastolic (congestive) heart failure: Secondary | ICD-10-CM | POA: Insufficient documentation

## 2023-11-08 DIAGNOSIS — N1831 Chronic kidney disease, stage 3a: Secondary | ICD-10-CM | POA: Insufficient documentation

## 2023-11-08 DIAGNOSIS — F0280 Dementia in other diseases classified elsewhere without behavioral disturbance: Secondary | ICD-10-CM | POA: Diagnosis not present

## 2023-11-08 DIAGNOSIS — M7989 Other specified soft tissue disorders: Secondary | ICD-10-CM | POA: Insufficient documentation

## 2023-11-08 DIAGNOSIS — E8809 Other disorders of plasma-protein metabolism, not elsewhere classified: Secondary | ICD-10-CM | POA: Insufficient documentation

## 2023-11-08 DIAGNOSIS — N281 Cyst of kidney, acquired: Secondary | ICD-10-CM | POA: Diagnosis not present

## 2023-11-08 DIAGNOSIS — G309 Alzheimer's disease, unspecified: Secondary | ICD-10-CM | POA: Diagnosis not present

## 2023-11-08 DIAGNOSIS — R7989 Other specified abnormal findings of blood chemistry: Secondary | ICD-10-CM | POA: Insufficient documentation

## 2023-11-08 MED ORDER — IOHEXOL 300 MG/ML  SOLN
100.0000 mL | Freq: Once | INTRAMUSCULAR | Status: AC | PRN
  Administered 2023-11-08: 100 mL via INTRAVENOUS

## 2023-11-22 DIAGNOSIS — Z23 Encounter for immunization: Secondary | ICD-10-CM | POA: Diagnosis not present

## 2023-11-22 DIAGNOSIS — Z1331 Encounter for screening for depression: Secondary | ICD-10-CM | POA: Diagnosis not present

## 2023-11-22 DIAGNOSIS — I509 Heart failure, unspecified: Secondary | ICD-10-CM | POA: Diagnosis not present

## 2023-12-06 ENCOUNTER — Inpatient Hospital Stay: Attending: Oncology

## 2023-12-06 DIAGNOSIS — N400 Enlarged prostate without lower urinary tract symptoms: Secondary | ICD-10-CM | POA: Diagnosis not present

## 2023-12-06 DIAGNOSIS — R803 Bence Jones proteinuria: Secondary | ICD-10-CM | POA: Insufficient documentation

## 2023-12-06 DIAGNOSIS — D472 Monoclonal gammopathy: Secondary | ICD-10-CM | POA: Insufficient documentation

## 2023-12-06 DIAGNOSIS — D539 Nutritional anemia, unspecified: Secondary | ICD-10-CM | POA: Diagnosis not present

## 2023-12-06 LAB — CMP (CANCER CENTER ONLY)
ALT: 15 U/L (ref 0–44)
AST: 22 U/L (ref 15–41)
Albumin: 3.9 g/dL (ref 3.5–5.0)
Alkaline Phosphatase: 80 U/L (ref 38–126)
Anion gap: 8 (ref 5–15)
BUN: 20 mg/dL (ref 8–23)
CO2: 26 mmol/L (ref 22–32)
Calcium: 9 mg/dL (ref 8.9–10.3)
Chloride: 109 mmol/L (ref 98–111)
Creatinine: 1.39 mg/dL — ABNORMAL HIGH (ref 0.61–1.24)
GFR, Estimated: 52 mL/min — ABNORMAL LOW (ref >60)
Glucose, Bld: 92 mg/dL (ref 70–99)
Potassium: 4 mmol/L (ref 3.5–5.1)
Sodium: 143 mmol/L (ref 135–145)
Total Bilirubin: 0.7 mg/dL (ref 0.0–1.2)
Total Protein: 6.8 g/dL (ref 6.5–8.1)

## 2023-12-06 LAB — CBC WITH DIFFERENTIAL (CANCER CENTER ONLY)
Abs Immature Granulocytes: 0.01 10*3/uL (ref 0.00–0.07)
Basophils Absolute: 0 10*3/uL (ref 0.0–0.1)
Basophils Relative: 1 %
Eosinophils Absolute: 0.1 10*3/uL (ref 0.0–0.5)
Eosinophils Relative: 3 %
HCT: 38.3 % — ABNORMAL LOW (ref 39.0–52.0)
Hemoglobin: 12.4 g/dL — ABNORMAL LOW (ref 13.0–17.0)
Immature Granulocytes: 0 %
Lymphocytes Relative: 20 %
Lymphs Abs: 0.7 10*3/uL (ref 0.7–4.0)
MCH: 34.3 pg — ABNORMAL HIGH (ref 26.0–34.0)
MCHC: 32.4 g/dL (ref 30.0–36.0)
MCV: 106.1 fL — ABNORMAL HIGH (ref 80.0–100.0)
Monocytes Absolute: 0.3 10*3/uL (ref 0.1–1.0)
Monocytes Relative: 9 %
Neutro Abs: 2.5 10*3/uL (ref 1.7–7.7)
Neutrophils Relative %: 67 %
Platelet Count: 180 10*3/uL (ref 150–400)
RBC: 3.61 MIL/uL — ABNORMAL LOW (ref 4.22–5.81)
RDW: 12.4 % (ref 11.5–15.5)
WBC Count: 3.7 10*3/uL — ABNORMAL LOW (ref 4.0–10.5)
nRBC: 0 % (ref 0.0–0.2)

## 2023-12-07 LAB — BETA 2 MICROGLOBULIN, SERUM: Beta-2 Microglobulin: 1.7 mg/L (ref 0.6–2.4)

## 2023-12-09 LAB — KAPPA/LAMBDA LIGHT CHAINS
Kappa free light chain: 22.7 mg/L — ABNORMAL HIGH (ref 3.3–19.4)
Kappa, lambda light chain ratio: 0.09 — ABNORMAL LOW (ref 0.26–1.65)
Lambda free light chains: 260.3 mg/L — ABNORMAL HIGH (ref 5.7–26.3)

## 2023-12-09 LAB — PROTEIN ELECTROPHORESIS, SERUM
A/G Ratio: 1.3 (ref 0.7–1.7)
Albumin ELP: 3.6 g/dL (ref 2.9–4.4)
Alpha-1-Globulin: 0.2 g/dL (ref 0.0–0.4)
Alpha-2-Globulin: 0.6 g/dL (ref 0.4–1.0)
Beta Globulin: 0.9 g/dL (ref 0.7–1.3)
Gamma Globulin: 1 g/dL (ref 0.4–1.8)
Globulin, Total: 2.8 g/dL (ref 2.2–3.9)
Total Protein ELP: 6.4 g/dL (ref 6.0–8.5)

## 2023-12-13 ENCOUNTER — Other Ambulatory Visit: Payer: Self-pay

## 2023-12-13 DIAGNOSIS — D472 Monoclonal gammopathy: Secondary | ICD-10-CM | POA: Diagnosis not present

## 2023-12-17 DIAGNOSIS — F05 Delirium due to known physiological condition: Secondary | ICD-10-CM | POA: Diagnosis not present

## 2023-12-17 DIAGNOSIS — I679 Cerebrovascular disease, unspecified: Secondary | ICD-10-CM | POA: Diagnosis not present

## 2023-12-17 DIAGNOSIS — R251 Tremor, unspecified: Secondary | ICD-10-CM | POA: Diagnosis not present

## 2023-12-17 DIAGNOSIS — G479 Sleep disorder, unspecified: Secondary | ICD-10-CM | POA: Diagnosis not present

## 2023-12-17 DIAGNOSIS — R413 Other amnesia: Secondary | ICD-10-CM | POA: Diagnosis not present

## 2023-12-18 LAB — IFE+PROTEIN ELECTRO, 24-HR UR
% BETA, Urine: 12.1 %
ALPHA 1 URINE: 2.9 %
Albumin, U: 18.8 %
Alpha 2, Urine: 8.5 %
GAMMA GLOBULIN URINE: 57.7 %
M-SPIKE %, Urine: 51.2 % — ABNORMAL HIGH
M-Spike, Mg/24 Hr: 91 mg/(24.h) — ABNORMAL HIGH
Total Protein, Urine-Ur/day: 178 mg/(24.h) — ABNORMAL HIGH (ref 30–150)
Total Protein, Urine: 50.6 mg/dL
Total Volume: 352

## 2023-12-20 ENCOUNTER — Encounter: Payer: Self-pay | Admitting: Oncology

## 2023-12-20 ENCOUNTER — Inpatient Hospital Stay: Admitting: Oncology

## 2023-12-20 VITALS — BP 143/84 | HR 65 | Temp 96.4°F | Resp 18 | Wt 164.7 lb

## 2023-12-20 DIAGNOSIS — D472 Monoclonal gammopathy: Secondary | ICD-10-CM

## 2023-12-20 DIAGNOSIS — R803 Bence Jones proteinuria: Secondary | ICD-10-CM | POA: Diagnosis not present

## 2023-12-20 DIAGNOSIS — N400 Enlarged prostate without lower urinary tract symptoms: Secondary | ICD-10-CM

## 2023-12-20 DIAGNOSIS — D539 Nutritional anemia, unspecified: Secondary | ICD-10-CM | POA: Diagnosis not present

## 2023-12-20 NOTE — Assessment & Plan Note (Signed)
 BPH, he follows up with urology

## 2023-12-20 NOTE — Progress Notes (Signed)
 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  Bone marrow biopsy showed 8% plasma cells, normal cytogenetics, negative congo red staining.  T (11;14), negative skeletal survey, no anemia, kidney function is worse.  Lab Results  Component Value Date   MPROTEIN Not Observed 07/26/2023   KPAFRELGTCHN 22.7 (H) 12/06/2023   LAMBDASER 260.3 (H) 12/06/2023   KAPLAMBRATIO 0.09 (L) 12/06/2023   24 hour UPEP M protein-91mg , slowly progressing. Consider repeat bone marrow biopsy in the future Kidney function flucturates. Encourage oral hydration and avoid nephrotoxins.  I recommend close monitor and repeat levels in 4 months.      Anemia, macrocytic Normal vitamin B12 level and folate level. Due to monoclonal gammopathy Monitor stable counts.   Enlarged prostate BPH, Brendan Carter follows up with urology   Orders Placed This Encounter  Procedures   CMP (Cancer Center only)    Standing Status:   Future    Expected Date:   04/18/2024    Expiration Date:   07/17/2024   CBC with Differential (Cancer Center Only)    Standing Status:   Future    Expected Date:   04/18/2024    Expiration Date:   07/17/2024   Multiple Myeloma Panel (SPEP&IFE w/QIG)    Standing Status:   Future    Expected Date:   04/18/2024    Expiration Date:   07/17/2024   Kappa/lambda light chains    Standing Status:   Future    Expected Date:   04/18/2024    Expiration Date:   07/17/2024   IFE AND PE, RANDOM URINE    Standing Status:   Future    Expected  Date:   04/18/2024    Expiration Date:   07/17/2024   Beta 2 microglobulin, serum    Standing Status:   Future    Expected Date:   04/18/2024    Expiration Date:   07/17/2024   # follow-up 4 months.  All questions were answered. The Brendan Carter knows to call the clinic with any problems, questions or concerns.  Call Babara, MD, PhD [facility] Hematology Oncology 12/20/2023     HISTORY OF PRESENTING ILLNESS:   Brendan Carter had blood work done on 09/20/2021, CBC showed white count of 3.7, differential shows lymphocytopenia.  Hemoglobin 13.4, MCV 104.6.  Vitamin B12 265.  Brendan Carter received 4 doses of vitamin B12 injections 02/02/2022 repeat B12 level >1500.  TSH was normal.  CBC shows persistent leukopenia, predominantly lymphocytopenia.  Persistent mild decrease of hemoglobin of 13.3, MCV 106. Brendan Carter denies any routine alcohol use.  Denies any recent infection Brendan Carter has lost weight, February 2023, Brendan Carter weighed 159 pounds.  Currently Brendan Carter weighs 140 pounds.  Appetite is fair and wife reports Brendan Carter is eating his usual portion of food.  Denies night sweats or fever. Denies any over-the-counter supplement intake.   INTERVAL HISTORY Brendan Carter is a 78 y.o. male who has above history reviewed by me today presents for follow up visit for light chain MGUS No new complaints. Brendan Carter has memory loss, possible Alzheimer's disease.  Denies bone pain,  frequent infections. + chronic lower extremities edema.  When seen 11/08/23 wand was placed unnawrap on R leg but Brendan Carter took it off when Brendan Carter got home. Right lower extremity US  negative. 11/08/2023 CT venogram abd/pel showed No evidence of iliocaval DVT or obstruction   MEDICAL HISTORY:  Past Medical History:  Diagnosis Date   BPH (benign prostatic hyperplasia)    Elevated PSA    Hydrocele    Hypertension    Inguinal hernia    left   Prostatitis    Weak urinary stream     SURGICAL HISTORY: Past Surgical History:  Procedure Laterality Date   HERNIA REPAIR Right     hydrocelectomy Right 2013    SOCIAL HISTORY: Social History   Socioeconomic History   Marital status: Single    Spouse name: Not on file   Number of children: Not on file   Years of education: Not on file   Highest education level: Not on file  Occupational History   Not on file  Tobacco Use   Smoking status: Never    Passive exposure: Never   Smokeless tobacco: Never  Substance and Sexual Activity   Alcohol use: No    Alcohol/week: 0.0 standard drinks of alcohol   Drug use: No   Sexual activity: Not on file  Other Topics Concern   Not on file  Social History Narrative   Not on file   Social Drivers of Health   Financial Resource Strain: Low Risk  (11/01/2023)   Received [facility]   Overall Financial Resource Strain (CARDIA)    Difficulty of Paying Living Expenses: Not hard at all  Food Insecurity: No Food Insecurity (11/01/2023)   Received from [facility]   Hunger Vital Sign    Within the past 12 months, you worried that your food would run out before you got the money to buy more.: Never true    Within the past 12 months, the food you bought just didn't last and you didn't have money to get more.: Never true  Transportation Needs: No Transportation Needs (11/01/2023)   Received from [facility] - Transportation    In the past 12 months, has lack of transportation kept you from medical appointments or from getting medications?: No    Lack of Transportation (Non-Medical): No  Physical Activity: Not on file  Stress: Not on file  Social Connections: Not on file  Intimate Partner Violence: Not on file    FAMILY HISTORY: Family History  Problem Relation Age of Onset   Kidney cancer Neg Hx    Kidney disease Neg Hx    Prostate cancer Neg Hx     ALLERGIES:  has no known allergies.  MEDICATIONS:  Current Outpatient Medications  Medication Sig Dispense Refill   carbidopa-levodopa (SINEMET IR)  25-100 MG tablet Take by mouth.     donepezil (ARICEPT) 10 MG tablet Take 10 mg by mouth at bedtime.     finasteride  (PROSCAR ) 5 MG tablet Take 1 tablet by mouth once daily 90 tablet 3   furosemide (LASIX) 20 MG tablet Take 20 mg by mouth every other day.     nitroGLYCERIN (NITROSTAT) 0.4 MG SL tablet Place under the tongue.     QUEtiapine (SEROQUEL) 25 MG tablet Take 25 mg by mouth.     traZODone (DESYREL) 50 MG tablet Take 50-100 mg by mouth at bedtime.     No current facility-administered medications for this visit.  Review of Systems  Unable to perform ROS: Dementia  Constitutional:  Negative for appetite change, fatigue and unexpected weight change.  Cardiovascular:  Positive for leg swelling.  Psychiatric/Behavioral:         Memory loss    PHYSICAL EXAMINATION:  Vitals:   12/20/23 1025  BP: (!) 143/84  Pulse: 65  Resp: 18  Temp: (!) 96.4 F (35.8 C)  SpO2: 92%   Filed Weights   12/20/23 1025  Weight: 164 lb 11.2 oz (74.7 kg)    Physical Exam Constitutional:      General: Brendan Carter is not in acute distress. HENT:     Head: Normocephalic.  Eyes:     General: No scleral icterus. Cardiovascular:     Rate and Rhythm: Normal rate.  Pulmonary:     Effort: Pulmonary effort is normal. No respiratory distress.  Abdominal:     General: Bowel sounds are normal. There is no distension.     Palpations: Abdomen is soft.  Musculoskeletal:        General: No deformity. Normal range of motion.     Cervical back: Normal range of motion and neck supple.     Right lower leg: Edema present.     Left lower leg: Edema present.  Skin:    Findings: No rash.  Neurological:     Mental Status: Brendan Carter is alert. Mental status is at baseline.  Psychiatric:        Mood and Affect: Mood normal.      LABORATORY DATA:  I have reviewed the data as listed     Latest Ref Rng & Units 12/06/2023   10:08 AM 07/26/2023    9:52 AM 04/19/2023    2:15 PM  CBC  WBC 4.0 - 10.5 K/uL 3.7  3.3  4.1    Hemoglobin 13.0 - 17.0 g/dL 87.5  87.8  87.2   Hematocrit 39.0 - 52.0 % 38.3  37.3  38.5   Platelets 150 - 400 K/uL 180  158  165       Latest Ref Rng & Units 12/06/2023   10:08 AM 07/26/2023    9:52 AM 04/19/2023    2:15 PM  CMP  Glucose 70 - 99 mg/dL 92  892  882   BUN 8 - 23 mg/dL 20  20  26    Creatinine 0.61 - 1.24 mg/dL 8.60  8.81  8.59   Sodium 135 - 145 mmol/L 143  139  143   Potassium 3.5 - 5.1 mmol/L 4.0  3.8  4.0   Chloride 98 - 111 mmol/L 109  107  106   CO2 22 - 32 mmol/L 26  26  28    Calcium 8.9 - 10.3 mg/dL 9.0  8.2  8.7   Total Protein 6.5 - 8.1 g/dL 6.8  6.4  6.3   Total Bilirubin 0.0 - 1.2 mg/dL 0.7  0.7  0.5   Alkaline Phos 38 - 126 U/L 80  76  66   AST 15 - 41 U/L 22  19  24    ALT 0 - 44 U/L 15  12  17       RADIOGRAPHIC STUDIES: I have personally reviewed the radiological images as listed and agreed with the findings in the report. CT VENOGRAM ABD/PEL Result Date: 11/08/2023 EXAM: CT VENOGRAM ABDOMEN AND PELVIS WITH CONTRAST 11/08/2023 12:36:37 PM TECHNIQUE: CTA images of the abdomen and pelvis with intravenous contrast, using venous phase timing . Three-dimensional MIP/volume rendered formations were performed. Automated exposure control, iterative  reconstruction, and/or weight based adjustment of the mA/kV was utilized to reduce the radiation dose to as low as reasonably achievable. COMPARISON: PET CT 05/03/2023 and previous. CLINICAL HISTORY: Right leg swelling, positive D dimer, Alzheimer disease, stage 3a chronic kidney disease, plasma cell dyscrasia, CHF NYHA class I, chronic, diastolic. FINDINGS: VASCULATURE: Patent iliac venous system. The left external iliac vein is incompletely distended. Patent IVC, renal veins, portal vein, SMV. AORTA: No acute finding. No abdominal aortic aneurysm. No dissection. CELIAC TRUNK AND SUPERIOR MESENTERIC ARTERY : Common origin from the aorta, anatomic variant. No acute finding. No occlusion or significant stenosis. RENAL  ARTERIES: No acute finding. No occlusion or significant stenosis. ILIAC ARTERIES: No acute finding. No occlusion or significant stenosis. LIVER: The liver is unremarkable. GALLBLADDER AND BILE DUCTS: Gallbladder is unremarkable. No biliary ductal dilatation. SPLEEN: The spleen is unremarkable. PANCREAS: The pancreas is unremarkable. ADRENAL GLANDS: Bilateral adrenal glands demonstrate no acute abnormality. KIDNEYS, URETERS AND BLADDER: Multiple renal cortical and parapelvic cysts as before. No stones in the kidneys or ureters. No hydronephrosis. No perinephric or periureteral stranding. Urinary bladder incompletely distended with moderate diffuse wall thickening. GI AND BOWEL: Stomach and duodenal sweep demonstrate no acute abnormality. There is no bowel obstruction. No abnormal bowel wall thickening or distension. REPRODUCTIVE: There is marked prostatic enlargement protruding significantly into the lumen of the urinary bladder. Large left scrotal hydrocele. PERITONEUM AND RETRPERITONEUM: No ascites or free air. LUNG BASE: Minimal atelectasis in the visualized lung bases right greater than left. LYMPH NODES: No lymphadenopathy. BONES AND SOFT TISSUES: Surgical clips from laparoscopic right hernia repair. Left pelvic phleboliths. No acute abnormality of the bones. No acute soft tissue abnormality. IMPRESSION: 1. No evidence of iliocaval DVT or obstruction 2.  prostatomegaly with thick-walled urinary bladder. 3.  large left scrotal hydrocele as before. Electronically signed by: Dayne Hassell MD 11/08/2023 01:06 PM EDT RP Workstation: [id number]   US  Venous Img Lower Unilateral Right (DVT) Result Date: 10/31/2023 CLINICAL DATA:  Right lower extremity swelling for 1 month EXAM: RIGHT LOWER EXTREMITY VENOUS DOPPLER ULTRASOUND TECHNIQUE: Gray-scale sonography with graded compression, as well as color Doppler and duplex ultrasound were performed to evaluate the lower extremity deep venous systems from the level of the  common femoral vein and including the common femoral, femoral, profunda femoral, popliteal and calf veins including the posterior tibial, peroneal and gastrocnemius veins when visible. The superficial great saphenous vein was also interrogated. Spectral Doppler was utilized to evaluate flow at rest and with distal augmentation maneuvers in the common femoral, femoral and popliteal veins. COMPARISON:  None Available. FINDINGS: Contralateral Common Femoral Vein: Respiratory phasicity is normal and symmetric with the symptomatic side. No evidence of thrombus. Normal compressibility. Common Femoral Vein: No evidence of thrombus. Normal compressibility, respiratory phasicity and response to augmentation. Saphenofemoral Junction: No evidence of thrombus. Normal compressibility and flow on color Doppler imaging. Profunda Femoral Vein: No evidence of thrombus. Normal compressibility and flow on color Doppler imaging. Femoral Vein: No evidence of thrombus. Normal compressibility, respiratory phasicity and response to augmentation. Popliteal Vein: No evidence of thrombus. Normal compressibility, respiratory phasicity and response to augmentation. Calf Veins: No evidence of thrombus. Normal compressibility and flow on color Doppler imaging. Superficial Great Saphenous Vein: No evidence of thrombus. Normal compressibility. Venous Reflux:  None. Other Findings: Extensive subcutaneous edema from the proximal calf through the ankle. IMPRESSION: No evidence of deep venous thrombosis. There is extensive subcutaneous edema from the proximal calf to the ankle. Findings may reflect volume overload or  cellulitis. Electronically Signed   By: Wilkie Lent M.D.   On: 10/31/2023 16:19

## 2023-12-20 NOTE — Assessment & Plan Note (Signed)
 Normal vitamin B12 level and folate level. Due to monoclonal gammopathy Monitor stable counts.

## 2023-12-20 NOTE — Assessment & Plan Note (Addendum)
 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  Bone marrow biopsy showed 8% plasma cells, normal cytogenetics, negative congo red staining.  T (11;14), negative skeletal survey, no anemia, kidney function is worse.  Lab Results  Component Value Date   MPROTEIN Not Observed 07/26/2023   KPAFRELGTCHN 22.7 (H) 12/06/2023   LAMBDASER 260.3 (H) 12/06/2023   KAPLAMBRATIO 0.09 (L) 12/06/2023   24 hour UPEP M protein-91mg , slowly progressing. Consider repeat bone marrow biopsy in the future Kidney function flucturates. Encourage oral hydration and avoid nephrotoxins.  I recommend close monitor and repeat levels in 4 months.

## 2023-12-27 DIAGNOSIS — F02811 Dementia in other diseases classified elsewhere, unspecified severity, with agitation: Secondary | ICD-10-CM | POA: Diagnosis not present

## 2023-12-27 DIAGNOSIS — G309 Alzheimer's disease, unspecified: Secondary | ICD-10-CM | POA: Diagnosis not present

## 2024-01-03 DIAGNOSIS — I509 Heart failure, unspecified: Secondary | ICD-10-CM | POA: Diagnosis not present

## 2024-01-10 ENCOUNTER — Other Ambulatory Visit

## 2024-01-18 DIAGNOSIS — R4182 Altered mental status, unspecified: Secondary | ICD-10-CM | POA: Diagnosis not present

## 2024-01-18 DIAGNOSIS — R918 Other nonspecific abnormal finding of lung field: Secondary | ICD-10-CM | POA: Diagnosis not present

## 2024-01-18 DIAGNOSIS — R9431 Abnormal electrocardiogram [ECG] [EKG]: Secondary | ICD-10-CM | POA: Diagnosis not present

## 2024-01-18 DIAGNOSIS — F05 Delirium due to known physiological condition: Secondary | ICD-10-CM | POA: Diagnosis not present

## 2024-01-18 DIAGNOSIS — Z8673 Personal history of transient ischemic attack (TIA), and cerebral infarction without residual deficits: Secondary | ICD-10-CM | POA: Diagnosis not present

## 2024-01-18 DIAGNOSIS — I517 Cardiomegaly: Secondary | ICD-10-CM | POA: Diagnosis not present

## 2024-01-18 DIAGNOSIS — Z79899 Other long term (current) drug therapy: Secondary | ICD-10-CM | POA: Diagnosis not present

## 2024-01-20 DIAGNOSIS — I509 Heart failure, unspecified: Secondary | ICD-10-CM | POA: Diagnosis not present

## 2024-01-27 NOTE — Progress Notes (Signed)
 Today the history is gathered from: 0% - patient  100% - patients wife   RECORDS SUMMARY: I have reviewed the note dated 12/22/2021 from Dr. Epifanio who has indicated:  Memory disorder  Given these abnormal neurologic findings, a referral to neurology has been recommended.  REFERRING PHYSICIAN: Pcp PRIMARY CARE PHYSICIAN:  Epifanio Alm Dover, MD   IMPRESSION/PLAN  Brendan Carter is a 78 y.o. male presenting for evaluation of  SEVERE DEMENTIA/ MEMORY LOSS/ SUNDOWNING/ AGITATION/ AGGRESSION/ TREMORS/ SLEEP DIFFICULTY  -Worsening. -Patient with severe dementia with worsening sundowning, agitation and aggression. Patient with recent ER visit on 01/18/2024 for increased verbal and physical aggressiveness, especially when bathing. Wife says that everything breaks loose in the evenings. Having possible visual hallucinations. Memory is worsening. Sleep is well managed with medication.Taking Aricept 10 mg at night, Seroquel 50 mg in the morning and 100 mg at night and Trazodone 150 mg at night.   -Start Depakote 125 mg twice daily for one week and then increase to 250 mg twice daily for sundowning, agitation, aggression.  -Continue taking Aricept 10 mg at night for memory loss.  -Continue taking Trazodone 150 mg at night for sleep.  -Continue taking Seroquel 50 mg in the morning and 100 mg at night for sundowning and sleep.  -Continue taking Aspirin 81 mg daily for stroke prevention.  -Could consider switching Seroquel 50 mg to afternoon or evening instead of taking in the morning for sundowning.    Over 45 minutes of physician time has been spent before, during and after this visit which includes time for precharting and preparing to see the patient, family and/or caregiver, documenting clinical information in the electronic or other health record, independently interpreting results and communicating results to the patient, family and/or caregiver, getting and/or reviewing separately obtained  history, ordering medications, tests or procedures, performing a medically appropriate exam and/or evaluation, care coordination, counseling and educating the patient, and referring the patient to and communicating with other health care professionals as needed.     Medications previously tried: Sinemet 25-100 mg 1 tablet BID - drowsiness, fatigue  Follow-up with Dr. Lane in 4-6 weeks as a video visit   p=4   CHIEF COMPLAINT & HPI  Brendan Carter is a 78 y.o. male presenting for evaluation of: Chief Complaint  Patient presents with   Memory Loss    SEVERE DEMENTIA/ MEMORY LOSS/ SUNDOWNING/ AGITATION/ AGGRESSION/ TREMORS/ SLEEP DIFFICULTY  Patient with recent ER visit on 01/18/2024 for altered mental status. Presented to ER with increased verbal and physical aggressiveness, especially when bathing.  Was started on Buspar titration, however patient has not started yet per recommendation of PCP. Was advised to follow up and discuss with Neurology. Was also recommended to switch Seroquel to 50 mg in the morning and 100 mg at night per PCP for aggression. Patient with worsening memory loss sundowning in the past months. Increased confusion, agitation. Wife says that everything breaks loose in the evenings. Wife suspects that patient is having visual hallucinations, is seeing things on the floor. Wife notes that patient is till able to recognizing people and familiar locations. Does misplace items. Difficulty using complex tools and devices and following instructions. Does not drive. Wife manages finances and medications. Patient requres assistance with ADLs. Will become especially aggressive when trying to bathe. Currently has help of niece who is a therapist, art for dementia patients. Sleep is well managed with medication. Wife is interested in home health or placing patient in a skilled nursing facility. Taking  Aricept 10 mg at night, Seroquel 50 mg in the morning and 100 mg at night and Trazodone 150 mg  at night.     DATA SUMMARY: 01/19/2024 CT BRAIN WO CONTRAST IMPRESSION:  1.  No acute intracranial abnormalities. 2.  Areas of periventricular and patchy subcortical white matter, nonspecific and most likely reflecting sequela of moderate to severe chronic microvascular ischemic changes.   02/28/2022 MRA NECK W WO CONTRAST IMPRESSION:  *No evidence of high-grade stenosis, aneurysm or asymmetry in the common, external and internal carotid arteries in the neck. The vertebral arteries are unremarkable.  *Retropharyngeal course of the bilateral internal carotid arteries.   02/28/2022  MRI HEAD W WO CONTRAST and MRA HEAD W WO CONTRAST IMPRESSION *Chronic small vessel ischemic changes. No acute intracranial hemorrhage or acute ischemic infarct.  *No evidence of significant stenosis, occlusion or aneurysm in the visualized intracranial cerebral arteries.  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   01/19/2022 CT HEAD WO CONTRAST IMPRESSION:  1. No acute findings.  2. Minimal chronic ischemic microvascular disease.   VISIT SUMMARIES:   MEDICATIONS Current Outpatient Medications  Medication Sig Dispense Refill   aspirin 81 MG EC tablet Take 1 tablet (81 mg total) by mouth once daily 30 tablet 11   cyanocobalamin (VITAMIN B12) 1000 MCG tablet Take 1,000 mcg by mouth once daily     donepeziL (ARICEPT) 10 MG tablet Take 10 mg by mouth at bedtime     finasteride  (PROSCAR ) 5 mg tablet Take 5 mg by mouth once daily     QUEtiapine (SEROQUEL) 100 MG tablet Take 0.5 tablets (50 mg total) by mouth 2 (two) times daily Take 2-4 tabs (100-200 mg) at night for sleep and sundowning. (Patient taking differently: Take by mouth at bedtime Taking 50 mg in the morning and 100 mg at night) 30 tablet 11   traZODone (DESYREL) 50 MG tablet Take 150 mg by mouth at bedtime     No current facility-administered medications for  this visit.    ALLERGIES No Known Allergies   EXAM   Vitals:   01/27/24 0830  BP: 118/78  Weight: 71.3 kg (157 lb 3.2 oz)  Height: 175.3 cm (5' 9)  PainSc: 0-No pain     Body mass index is 23.21 kg/m.  MEMORY EVALUATION: 05/21/2023 - 08/29 (omit writing sentence due to tremor) 03/12/2022 - 13/30   GENERAL: Pleasant male. NAD. Normocephalic and atraumatic.  The baseline comprehensive neurological exam obtained at prior office visit.  Significant changes from today's visit are shown in bold.  EYES: PERRLA EOM's intact  MUSCULOSKELETAL: Bulk - Normal Tone - Normal Pronator Drift - Absent bilaterally. Ambulation - Gait and station is steady.  Normal bilateral arm swing.  No shuffling. Romberg - deferred  Tremor -mild to moderate postural tremor of bilateral upper extremities  R/L 5/5    Shoulder abduction (deltoid/supraspinatus, axillary/suprascapular n, C5) 5/5    Elbow flexion (biceps brachii, musculoskeletal n, C5-6) 5/5    Elbow extension (triceps, radial n, C7) 5/5    Finger adduction (interossei, ulnar n, T1)  5/5    Hip flexion (iliopsoas, L1/L2) 5/5    Knee flexion (hamstrings, sciatic n, L5/S1)  5/5    Knee extension (quadriceps, femoral n, L3/4) 5/5    Ankle dorsiflexion (tibialis anterior, deep fibular n, L4/5) 5/5    Ankle plantarflexion (gastroc, tibial n, S1)   NEUROLOGICAL: MENTAL STATUS: Patient is oriented to person, place and time.  Short-term memory is intact Long-term memory is intact.   Attention span and concentration are intact.   Naming and repetition are intact. Comprehension is intact.   Expressive speech is intact.   Patient's fund of knowledge is within normal limits for educational level.  CRANIAL NERVES: Visual acuity and visual fields are intact         Extraocular muscles are intact                        Facial sensation is intact bilaterally                Facial strength is intact bilaterally                    Hearing is intact bilaterally                              Palate elevates midline, normal phonation     Shoulder shrug strength is intact                    Tongue protrudes midline                       SENSATION: Pain and temperature (spinothalamic tracts) is normal. Position and vibration (dorsal columns) is normal.  COORDINATION/CEREBELLAR: Finger to nose testing is intact.   PAST MEDICAL HISTORY Past Medical History:  Diagnosis Date   BPH (benign prostatic hyperplasia)    Elevated PSA    Inguinal hernia, bilateral    Sepsis (CMS/HHS-HCC) 01/12/2015   Severe Alzheimer dementia (CMS-HCC)    Urinary tract infection 01/12/2015    PAST SURGICAL HISTORY Past Surgical History:  Procedure Laterality Date   COLONOSCOPY  11/24/2021   +cologuard/normal colon/no repeat/TKT   COLONOSCOPY  11/24/2021   Pos Cologuard/No Repeat/TKT   HERNIA REPAIR Right     FAMILY HISTORY Family History  Problem Relation Name Age of Onset   Diabetes Brother      SOCIAL HISTORY  Social History   Tobacco Use   Smoking status: Never  Vaping Use   Vaping status: Never Used  Substance Use Topics   Alcohol use: No   Drug use: Never     REVIEW OF SYSTEMS:  13 system ROS was verbally reviewed with patient. Pertinent positives and negatives are mentioned above in the HPI and all other systems are negative.   DATA  I have personally reviewed all of the data outlined below both prior to the appointment and during the appointment with the patient as appropriate.  Admission on 01/18/2024, Discharged on 01/19/2024  Component Date Value Ref Range Status   WBC (White Blood Cell Count) 01/18/2024 3.2  3.2 - 9.8 x109/L Final   Hemoglobin 01/18/2024 11.3 (L)  13.7 - 17.3 g/dL Final   Hematocrit 88/70/7974 35.2 (L)  39.0 - 49.0 % Final   Platelets 01/18/2024 167  150 - 450 x109/L Final   MCV (Mean Corpuscular Volume) 01/18/2024 105 (H)  80 - 98 fL Final   MCH (Mean  Corpuscular Hemoglobin) 01/18/2024 33.8  26.5 - 34.0 pg Final   MCHC (Mean Corpuscular Hemoglobin * 01/18/2024 32.1  31.5 - 36.3 % Final   RBC (Red Blood Cell Count) 01/18/2024 3.34 (L)  4.37 - 5.74 x1012/L Final   RDW-CV (Red Cell Distribution Widt* 01/18/2024 12.2  11.5 - 14.5 % Final   NRBC (Nucleated Red Blood  Cell Cou* 01/18/2024 0.00  0 x109/L Final   NRBC % (Nucleated Red Blood Cell %) 01/18/2024 0.0  % Final   MPV (Mean Platelet Volume) 01/18/2024 9.7  7.2 - 11.7 fL Final   Neutrophil Count 01/18/2024 1.9 (L)  2.0 - 8.6 x109/L Final   Neutrophil % 01/18/2024 58.7  37 - 80 % Final   Lymphocyte Count 01/18/2024 0.9  0.6 - 4.2 x109/L Final   Lymphocyte % 01/18/2024 26.5  10 - 50 % Final   Monocyte Count 01/18/2024 0.4  0 - 0.9 x109/L Final   Monocyte % 01/18/2024 11.4  0 - 12 % Final   Eosinophil Count 01/18/2024 0.09  0 - 0.70 x109/L Final   Eosinophil % 01/18/2024 2.8  0 - 7 % Final   Basophil Count 01/18/2024 0.01  0 - 0.20 x109/L Final   Basophil % 01/18/2024 0.3  0 - 2 % Final   Immature Granulocyte Count 01/18/2024 0.01  <=0.06 x109/L Final   Immature Granulocyte % 01/18/2024 0.3  <=0.7 % Final   Sodium 01/18/2024 141  135 - 145 mmol/L Final   Potassium 01/18/2024 4.3  3.5 - 5.0 mmol/L Final   Chloride 01/18/2024 108  98 - 108 mmol/L Final   Carbon Dioxide (CO2) 01/18/2024 26  21 - 30 mmol/L Final   Urea Nitrogen (BUN) 01/18/2024 18  7 - 20 mg/dL Final   Creatinine 88/70/7974 1.5 (H)  0.6 - 1.3 mg/dL Final   Glucose 88/70/7974 105  70 - 140 mg/dL Final   Calcium 88/70/7974 8.6 (L)  8.7 - 10.2 mg/dL Final   AST (Aspartate Aminotransferase) 01/18/2024 21  15 - 41 U/L Final   ALT (Alanine Aminotransferase) 01/18/2024 11 (L)  15 - 50 U/L Final   Bilirubin, Total 01/18/2024 0.6  0.4 - 1.5 mg/dL Final   Alk Phos (Alkaline Phosphatase) 01/18/2024 66  24 - 110 U/L Final   Albumin 01/18/2024 3.2 (L)  3.5 - 4.8 g/dL Final   Protein, Total  01/18/2024 5.7 (L)  6.2 - 8.1 g/dL Final   Anion Gap 88/70/7974 7  3 - 12 mmol/L Final   BUN/CREA Ratio 01/18/2024 12  6 - 27 Final   Glomerular Filtration Rate (eGFR)  01/18/2024 47  mL/min/1.73sq m Final   Ethanol 01/18/2024 <5  <5 mg/dL Final   Acetaminophen  01/18/2024 <10 (L)  10 - 25 mcg/mL Final   Salicylate 01/18/2024 <40 (L)  50 - 200 mg/L Final   Pro-Brain Natriuretic Peptide, N-t* 01/18/2024 133  <=850 pg/mL Final   POC Glucose 01/18/2024 117  70 - 140 mg/dL Final   Vent Rate (bpm) 01/19/2024 68   Final   PR Interval (msec) 01/19/2024 158   Final   QRS Interval (msec) 01/19/2024 106   Final   QT Interval (msec) 01/19/2024 416   Final   QTc (msec) 01/19/2024 442   Final  Office Visit on 12/27/2023  Component Date Value Ref Range Status   WBC (White Blood Cell Count) 12/27/2023 3.8 (L)  4.1 - 10.2 103/uL Final   RBC (Red Blood Cell Count) 12/27/2023 3.58 (L)  4.69 - 6.13 106/uL Final   Hemoglobin 12/27/2023 12.5 (L)  14.1 - 18.1 gm/dL Final   Hematocrit 88/92/7974 37.9 (L)  40.0 - 52.0 % Final   MCV (Mean Corpuscular Volume) 12/27/2023 105.9 (H)  80.0 - 100.0 fl Final   MCH (Mean Corpuscular Hemoglobin) 12/27/2023 34.9 (H)  27.0 - 31.2 pg Final   MCHC (Mean Corpuscular Hemoglobin * 12/27/2023  33.0  32.0 - 36.0 gm/dL Final   Platelet Count 12/27/2023 205  150 - 450 103/uL Final   RDW-CV (Red Cell Distribution Widt* 12/27/2023 12.1  11.6 - 14.8 % Final   MPV (Mean Platelet Volume) 12/27/2023 9.1 (L)  9.4 - 12.4 fl Final   Neutrophils 12/27/2023 2.47  1.50 - 7.80 103/uL Final   Lymphocytes 12/27/2023 0.87 (L)  1.00 - 3.60 103/uL Final   Monocytes 12/27/2023 0.30  0.00 - 1.50 103/uL Final   Eosinophils 12/27/2023 0.10  0.00 - 0.55 103/uL Final   Basophils 12/27/2023 0.01  0.00 - 0.09 103/uL Final   Neutrophil % 12/27/2023 65.6  32.0 - 70.0 % Final   Lymphocyte % 12/27/2023 23.1  10.0 - 50.0 % Final   Monocyte % 12/27/2023 8.0  4.0 - 13.0 %  Final   Eosinophil % 12/27/2023 2.7  1.0 - 5.0 % Final   Basophil% 12/27/2023 0.3  0.0 - 2.0 % Final   Immature Granulocyte % 12/27/2023 0.3  <=0.7 % Final   Immature Granulocyte Count 12/27/2023 0.01  <=0.06 10^3/L Final   Glucose 12/27/2023 84  70 - 110 mg/dL Final   Sodium 88/92/7974 145  136 - 145 mmol/L Final   Potassium 12/27/2023 4.1  3.6 - 5.1 mmol/L Final   Chloride 12/27/2023 109  97 - 109 mmol/L Final   Carbon Dioxide (CO2) 12/27/2023 30.8  22.0 - 32.0 mmol/L Final   Urea Nitrogen (BUN) 12/27/2023 18  7 - 25 mg/dL Final   Creatinine 88/92/7974 1.3  0.7 - 1.3 mg/dL Final   Glomerular Filtration Rate (eGFR) 12/27/2023 56 (L)  >60 mL/min/1.73sq m Final   Calcium 12/27/2023 9.0  8.7 - 10.3 mg/dL Final   AST  88/92/7974 16  8 - 39 U/L Final   ALT  12/27/2023 10  6 - 57 U/L Final   Alk Phos (alkaline Phosphatase) 12/27/2023 90  34 - 104 U/L Final   Albumin 12/27/2023 4.2  3.5 - 4.8 g/dL Final   Bilirubin, Total 12/27/2023 0.5  0.3 - 1.2 mg/dL Final   Protein, Total 12/27/2023 6.8  6.1 - 7.9 g/dL Final   A/G Ratio 88/92/7974 1.6  1.0 - 5.0 gm/dL Final   Thyroid  Stimulating Hormone (TSH) 12/27/2023 2.273  0.450-5.330 uIU/ml uIU/mL Final   Color 12/27/2023 Yellow  Colorless, Straw, Light Yellow, Yellow, Dark Yellow Final   Clarity 12/27/2023 Clear  Clear Final   Specific Gravity 12/27/2023 1.024  1.005 - 1.030 Final   pH, Urine 12/27/2023 6.0  5.0 - 8.0 Final   Protein, Urinalysis 12/27/2023 Trace (!)  Negative mg/dL Final   Glucose, Urinalysis 12/27/2023 Trace (!)  Negative mg/dL Final   Ketones, Urinalysis 12/27/2023 Negative  Negative mg/dL Final   Blood, Urinalysis 12/27/2023 Negative  Negative Final   Nitrite, Urinalysis 12/27/2023 Negative  Negative Final   Leukocyte Esterase, Urinalysis 12/27/2023 Trace (!)  Negative Final   Bilirubin, Urinalysis 12/27/2023 Negative  Negative Final   Urobilinogen, Urinalysis 12/27/2023 0.2  0.2 - 1.0 mg/dL  Final   WBC, UA 88/92/7974 3  <=5 /hpf Final   Red Blood Cells, Urinalysis 12/27/2023 2  <=3 /hpf Final   Bacteria, Urinalysis 12/27/2023 0-5  0 - 5 /hpf Final   Squamous Epithelial Cells, Urinaly* 12/27/2023 0  /hpf Final  Same Day Visit on 11/01/2023  Component Date Value Ref Range Status   Glucose 11/01/2023 87  70 - 110 mg/dL Final   Sodium 90/87/7974 142  136 - 145 mmol/L Final  Potassium 11/01/2023 4.3  3.6 - 5.1 mmol/L Final   Chloride 11/01/2023 105  97 - 109 mmol/L Final   Carbon Dioxide (CO2) 11/01/2023 32.8 (H)  22.0 - 32.0 mmol/L Final   Calcium 11/01/2023 9.1  8.7 - 10.3 mg/dL Final   Urea Nitrogen (BUN) 11/01/2023 21  7 - 25 mg/dL Final   Creatinine 90/87/7974 1.4 (H)  0.7 - 1.3 mg/dL Final   Glomerular Filtration Rate (eGFR) 11/01/2023 51 (L)  >60 mL/min/1.73sq m Final   BUN/Crea Ratio 11/01/2023 15.0  6.0 - 20.0 Final   Anion Gap w/K 11/01/2023 8.5  6.0 - 16.0 Final   PSA (Prostate Specific Antigen), T* 11/01/2023 14.50 (H)  0.10 - 4.00 ng/mL Final   Thyroid  Stimulating Hormone (TSH) 11/01/2023 2.307  0.450-5.330 uIU/ml uIU/mL Final   WBC (White Blood Cell Count) 11/01/2023 4.0 (L)  4.1 - 10.2 103/uL Final   RBC (Red Blood Cell Count) 11/01/2023 3.66 (L)  4.69 - 6.13 106/uL Final   Hemoglobin 11/01/2023 12.8 (L)  14.1 - 18.1 gm/dL Final   Hematocrit 90/87/7974 39.2 (L)  40.0 - 52.0 % Final   MCV (Mean Corpuscular Volume) 11/01/2023 107.1 (H)  80.0 - 100.0 fl Final   MCH (Mean Corpuscular Hemoglobin) 11/01/2023 35.0 (H)  27.0 - 31.2 pg Final   MCHC (Mean Corpuscular Hemoglobin * 11/01/2023 32.7  32.0 - 36.0 gm/dL Final   Platelet Count 11/01/2023 184  150 - 450 103/uL Final   RDW-CV (Red Cell Distribution Widt* 11/01/2023 12.1  11.6 - 14.8 % Final   MPV (Mean Platelet Volume) 11/01/2023 9.1 (L)  9.4 - 12.4 fl Final   Neutrophils 11/01/2023 2.67  1.50 - 7.80 103/uL Final   Lymphocytes 11/01/2023 0.90 (L)  1.00 - 3.60 103/uL Final    Monocytes 11/01/2023 0.35  0.00 - 1.50 103/uL Final   Eosinophils 11/01/2023 0.05  0.00 - 0.55 103/uL Final   Basophils 11/01/2023 0.02  0.00 - 0.09 103/uL Final   Neutrophil % 11/01/2023 66.6  32.0 - 70.0 % Final   Lymphocyte % 11/01/2023 22.5  10.0 - 50.0 % Final   Monocyte % 11/01/2023 8.8  4.0 - 13.0 % Final   Eosinophil % 11/01/2023 1.3  1.0 - 5.0 % Final   Basophil% 11/01/2023 0.5  0.0 - 2.0 % Final   Immature Granulocyte % 11/01/2023 0.3  <=0.7 % Final   Immature Granulocyte Count 11/01/2023 0.01  <=0.06 10^3/L Final   BNP (Brain Natriuretic Peptide) 11/01/2023 21.0  <=100.0 pg/mL Final  Same Day Visit on 10/28/2023  Component Date Value Ref Range Status   Glucose 10/28/2023 93  70 - 110 mg/dL Final   Sodium 90/91/7974 140  136 - 145 mmol/L Final   Potassium 10/28/2023 4.1  3.6 - 5.1 mmol/L Final   Chloride 10/28/2023 105  97 - 109 mmol/L Final   Carbon Dioxide (CO2) 10/28/2023 34.4 (H)  22.0 - 32.0 mmol/L Final   Urea Nitrogen (BUN) 10/28/2023 18  7 - 25 mg/dL Final   Creatinine 90/91/7974 1.2  0.7 - 1.3 mg/dL Final   Glomerular Filtration Rate (eGFR) 10/28/2023 62  >60 mL/min/1.73sq m Final   Calcium 10/28/2023 8.8  8.7 - 10.3 mg/dL Final   AST  90/91/7974 17  8 - 39 U/L Final   ALT  10/28/2023 11  6 - 57 U/L Final   Alk Phos (alkaline Phosphatase) 10/28/2023 97  34 - 104 U/L Final   Albumin 10/28/2023 3.9  3.5 - 4.8 g/dL Final   Bilirubin,  Total 10/28/2023 0.4  0.3 - 1.2 mg/dL Final   Protein, Total 10/28/2023 6.0 (L)  6.1 - 7.9 g/dL Final   A/G Ratio 90/91/7974 1.9  1.0 - 5.0 gm/dL Final   No follow-ups on file.  Payor: [employer]ICARE ADVANTAGE PLANS / Plan: [employer] HMO GOLD PLUS / Product Type: HMO /   This note is partially written by Roe Mater, in the presence of and acting as the scribe of Dr. Arthea Farrow.    I have reviewed, edited and added to the note as needed to reflect my best personal medical judgment.    Dr. Arthea Farrow, MD [facility] [place], [place] Ph:  [phone] Fax:  [phone]

## 2024-04-17 ENCOUNTER — Inpatient Hospital Stay

## 2024-04-22 ENCOUNTER — Other Ambulatory Visit

## 2024-04-29 ENCOUNTER — Ambulatory Visit: Admitting: Urology

## 2024-05-01 ENCOUNTER — Inpatient Hospital Stay: Admitting: Oncology
